# Patient Record
Sex: Female | Born: 1935 | Race: White | Hispanic: No
Health system: Southern US, Community
[De-identification: ages and names within clinical notes are randomized; demographics above are authoritative.]

---

## 2005-09-30 ENCOUNTER — Other Ambulatory Visit: Payer: Self-pay

## 2005-10-07 ENCOUNTER — Ambulatory Visit: Payer: Self-pay | Admitting: Ophthalmology

## 2012-10-19 ENCOUNTER — Ambulatory Visit: Payer: Self-pay | Admitting: Internal Medicine

## 2013-04-21 ENCOUNTER — Ambulatory Visit: Payer: Self-pay | Admitting: Oncology

## 2014-02-23 ENCOUNTER — Ambulatory Visit: Payer: Self-pay | Admitting: Physician Assistant

## 2014-02-23 LAB — CBC WITH DIFFERENTIAL/PLATELET
Basophil #: 0.1 10*3/uL (ref 0.0–0.1)
Basophil %: 1.1 %
Eosinophil #: 0.1 10*3/uL (ref 0.0–0.7)
Eosinophil %: 1.3 %
HCT: 40 % (ref 35.0–47.0)
HGB: 12.9 g/dL (ref 12.0–16.0)
LYMPHS ABS: 1.5 10*3/uL (ref 1.0–3.6)
Lymphocyte %: 18.8 %
MCH: 27.4 pg (ref 26.0–34.0)
MCHC: 32.3 g/dL (ref 32.0–36.0)
MCV: 85 fL (ref 80–100)
MONO ABS: 0.9 x10 3/mm (ref 0.2–0.9)
MONOS PCT: 10.4 %
NEUTROS ABS: 5.6 10*3/uL (ref 1.4–6.5)
Neutrophil %: 68.4 %
Platelet: 343 10*3/uL (ref 150–440)
RBC: 4.71 10*6/uL (ref 3.80–5.20)
RDW: 15.5 % — ABNORMAL HIGH (ref 11.5–14.5)
WBC: 8.2 10*3/uL (ref 3.6–11.0)

## 2014-02-27 ENCOUNTER — Inpatient Hospital Stay: Payer: Self-pay | Admitting: Internal Medicine

## 2014-02-27 LAB — CBC WITH DIFFERENTIAL/PLATELET
BASOS ABS: 0.1 10*3/uL (ref 0.0–0.1)
Basophil %: 0.9 %
EOS ABS: 0 10*3/uL (ref 0.0–0.7)
EOS PCT: 0 %
HCT: 40 % (ref 35.0–47.0)
HGB: 12.6 g/dL (ref 12.0–16.0)
LYMPHS PCT: 5.7 %
Lymphocyte #: 0.9 10*3/uL — ABNORMAL LOW (ref 1.0–3.6)
MCH: 27.3 pg (ref 26.0–34.0)
MCHC: 31.5 g/dL — ABNORMAL LOW (ref 32.0–36.0)
MCV: 87 fL (ref 80–100)
MONO ABS: 0.8 x10 3/mm (ref 0.2–0.9)
MONOS PCT: 4.8 %
Neutrophil #: 14.4 10*3/uL — ABNORMAL HIGH (ref 1.4–6.5)
Neutrophil %: 88.6 %
PLATELETS: 375 10*3/uL (ref 150–440)
RBC: 4.62 10*6/uL (ref 3.80–5.20)
RDW: 15.2 % — AB (ref 11.5–14.5)
WBC: 16.3 10*3/uL — AB (ref 3.6–11.0)

## 2014-02-27 LAB — BASIC METABOLIC PANEL
ANION GAP: 9 (ref 7–16)
BUN: 22 mg/dL — AB (ref 7–18)
CO2: 29 mmol/L (ref 21–32)
Calcium, Total: 9.8 mg/dL (ref 8.5–10.1)
Chloride: 101 mmol/L (ref 98–107)
Creatinine: 0.9 mg/dL (ref 0.60–1.30)
EGFR (African American): >60
Glucose: 152 mg/dL — ABNORMAL HIGH (ref 65–99)
Osmolality: 284 (ref 275–301)
Potassium: 3.9 mmol/L (ref 3.5–5.1)
SODIUM: 139 mmol/L (ref 136–145)

## 2014-02-27 LAB — LIPID PANEL
CHOLESTEROL: 165 mg/dL (ref 0–200)
HDL: 53 mg/dL (ref 40–60)
LDL CHOLESTEROL, CALC: 95 mg/dL (ref 0–100)
Triglycerides: 85 mg/dL (ref 0–200)
VLDL Cholesterol, Calc: 17 mg/dL (ref 5–40)

## 2014-02-27 LAB — PROTIME-INR
INR: 1.2
Prothrombin Time: 14.6 secs (ref 11.5–14.7)

## 2014-02-27 LAB — TROPONIN I
TROPONIN-I: 2.4 ng/mL — AB
Troponin-I: 3.1 ng/mL — ABNORMAL HIGH
Troponin-I: 2.8 ng/mL — ABNORMAL HIGH

## 2014-02-27 LAB — APTT
ACTIVATED PTT: 50.3 s — AB (ref 23.6–35.9)
Activated PTT: 30.9 secs (ref 23.6–35.9)

## 2014-02-27 LAB — CK-MB
CK-MB: 7.3 ng/mL — ABNORMAL HIGH (ref 0.5–3.6)
CK-MB: 8.1 ng/mL — ABNORMAL HIGH (ref 0.5–3.6)
CK-MB: 7.9 ng/mL — ABNORMAL HIGH (ref 0.5–3.6)

## 2014-02-28 LAB — BASIC METABOLIC PANEL
ANION GAP: 10 (ref 7–16)
BUN: 23 mg/dL — ABNORMAL HIGH (ref 7–18)
CHLORIDE: 107 mmol/L (ref 98–107)
CO2: 27 mmol/L (ref 21–32)
Calcium, Total: 8.9 mg/dL (ref 8.5–10.1)
Creatinine: 0.81 mg/dL (ref 0.60–1.30)
EGFR (African American): >60
EGFR (Non-African Amer.): >60
GLUCOSE: 136 mg/dL — AB (ref 65–99)
Osmolality: 293 (ref 275–301)
Potassium: 4.2 mmol/L (ref 3.5–5.1)
SODIUM: 144 mmol/L (ref 136–145)

## 2014-02-28 LAB — HEPARIN LEVEL (UNFRACTIONATED)
Anti-Xa(Unfractionated): 0.13 IU/mL — ABNORMAL LOW (ref 0.30–0.70)
Anti-Xa(Unfractionated): 0.4 IU/mL (ref 0.30–0.70)

## 2014-02-28 LAB — CBC WITH DIFFERENTIAL/PLATELET
BASOS ABS: 0 10*3/uL (ref 0.0–0.1)
BASOS PCT: 0.1 %
EOS PCT: 0 %
Eosinophil #: 0 10*3/uL (ref 0.0–0.7)
HCT: 36.3 % (ref 35.0–47.0)
HGB: 11.6 g/dL — ABNORMAL LOW (ref 12.0–16.0)
Lymphocyte #: 0.6 10*3/uL — ABNORMAL LOW (ref 1.0–3.6)
Lymphocyte %: 3.7 %
MCH: 27.7 pg (ref 26.0–34.0)
MCHC: 32.1 g/dL (ref 32.0–36.0)
MCV: 86 fL (ref 80–100)
MONO ABS: 0.7 x10 3/mm (ref 0.2–0.9)
MONOS PCT: 4.9 %
Neutrophil #: 13.9 10*3/uL — ABNORMAL HIGH (ref 1.4–6.5)
Neutrophil %: 91.3 %
PLATELETS: 303 10*3/uL (ref 150–440)
RBC: 4.21 10*6/uL (ref 3.80–5.20)
RDW: 15.3 % — AB (ref 11.5–14.5)
WBC: 15.3 10*3/uL — ABNORMAL HIGH (ref 3.6–11.0)

## 2014-03-01 LAB — CBC WITH DIFFERENTIAL/PLATELET
BASOS PCT: 0.1 %
Basophil #: 0 10*3/uL (ref 0.0–0.1)
EOS ABS: 0 10*3/uL (ref 0.0–0.7)
Eosinophil %: 0.1 %
HCT: 34.7 % — AB (ref 35.0–47.0)
HGB: 11.1 g/dL — ABNORMAL LOW (ref 12.0–16.0)
Lymphocyte #: 1 10*3/uL (ref 1.0–3.6)
Lymphocyte %: 8 %
MCH: 27.7 pg (ref 26.0–34.0)
MCHC: 31.9 g/dL — ABNORMAL LOW (ref 32.0–36.0)
MCV: 87 fL (ref 80–100)
MONO ABS: 0.9 x10 3/mm (ref 0.2–0.9)
Monocyte %: 7.4 %
Neutrophil #: 10.8 10*3/uL — ABNORMAL HIGH (ref 1.4–6.5)
Neutrophil %: 84.4 %
Platelet: 313 10*3/uL (ref 150–440)
RBC: 4.01 10*6/uL (ref 3.80–5.20)
RDW: 15.3 % — ABNORMAL HIGH (ref 11.5–14.5)
WBC: 12.8 10*3/uL — AB (ref 3.6–11.0)

## 2014-03-01 LAB — HEMOGLOBIN A1C: Hemoglobin A1C: 6 % (ref 4.2–6.3)

## 2014-03-01 LAB — BASIC METABOLIC PANEL
ANION GAP: 9 (ref 7–16)
BUN: 29 mg/dL — ABNORMAL HIGH (ref 7–18)
CHLORIDE: 104 mmol/L (ref 98–107)
CO2: 30 mmol/L (ref 21–32)
Calcium, Total: 8.7 mg/dL (ref 8.5–10.1)
Creatinine: 0.83 mg/dL (ref 0.60–1.30)
EGFR (African American): >60
EGFR (Non-African Amer.): >60
Glucose: 92 mg/dL (ref 65–99)
Osmolality: 290 (ref 275–301)
POTASSIUM: 4 mmol/L (ref 3.5–5.1)
SODIUM: 143 mmol/L (ref 136–145)

## 2014-03-01 LAB — BODY FLUID CELL COUNT WITH DIFFERENTIAL
BASOS ABS: 0 %
EOS PCT: 0 %
LYMPHS PCT: 78 %
NUCLEATED CELL COUNT: 1608 /mm3
Neutrophils: 19 %
Other Cells BF: 0 %
Other Mononuclear Cells: 3 %

## 2014-03-01 LAB — MAGNESIUM: Magnesium: 2.4 mg/dL

## 2014-03-01 LAB — HEPARIN LEVEL (UNFRACTIONATED): Anti-Xa(Unfractionated): 0.37 IU/mL (ref 0.30–0.70)

## 2014-03-01 LAB — PROTEIN, BODY FLUID: Protein, Body Fluid: 4.3 g/dL

## 2014-03-01 LAB — LACTATE DEHYDROGENASE, PLEURAL OR PERITONEAL FLUID: LDH, Body Fluid: 105 U/L

## 2014-03-01 LAB — GLUCOSE, SEROUS FLUID: Glucose, Body Fluid: 110 mg/dL

## 2014-03-01 LAB — AMYLASE, BODY FLUID: AMYLASE, BODY FLUID: 25 U/L

## 2014-03-02 LAB — CBC WITH DIFFERENTIAL/PLATELET
BASOS ABS: 0 10*3/uL (ref 0.0–0.1)
Basophil %: 0.1 %
EOS PCT: 0.1 %
Eosinophil #: 0 10*3/uL (ref 0.0–0.7)
HCT: 35.3 % (ref 35.0–47.0)
HGB: 11.3 g/dL — AB (ref 12.0–16.0)
LYMPHS PCT: 7.2 %
Lymphocyte #: 0.9 10*3/uL — ABNORMAL LOW (ref 1.0–3.6)
MCH: 27.7 pg (ref 26.0–34.0)
MCHC: 32.1 g/dL (ref 32.0–36.0)
MCV: 86 fL (ref 80–100)
MONOS PCT: 6 %
Monocyte #: 0.7 x10 3/mm (ref 0.2–0.9)
NEUTROS ABS: 10.3 10*3/uL — AB (ref 1.4–6.5)
NEUTROS PCT: 86.6 %
Platelet: 289 10*3/uL (ref 150–440)
RBC: 4.08 10*6/uL (ref 3.80–5.20)
RDW: 14.9 % — AB (ref 11.5–14.5)
WBC: 11.9 10*3/uL — ABNORMAL HIGH (ref 3.6–11.0)

## 2014-03-02 LAB — HEPARIN LEVEL (UNFRACTIONATED)
ANTI-XA(UNFRACTIONATED): 0.24 [IU]/mL — AB (ref 0.30–0.70)
Anti-Xa(Unfractionated): 0.32 IU/mL (ref 0.30–0.70)

## 2014-03-03 LAB — CBC WITH DIFFERENTIAL/PLATELET
BASOS ABS: 0 10*3/uL (ref 0.0–0.1)
BASOS PCT: 0.1 %
Eosinophil #: 0.1 10*3/uL (ref 0.0–0.7)
Eosinophil %: 0.9 %
HCT: 36.3 % (ref 35.0–47.0)
HGB: 11.6 g/dL — AB (ref 12.0–16.0)
LYMPHS PCT: 11.8 %
Lymphocyte #: 1.1 10*3/uL (ref 1.0–3.6)
MCH: 27.5 pg (ref 26.0–34.0)
MCHC: 31.9 g/dL — ABNORMAL LOW (ref 32.0–36.0)
MCV: 86 fL (ref 80–100)
MONO ABS: 0.8 x10 3/mm (ref 0.2–0.9)
Monocyte %: 8.6 %
NEUTROS ABS: 7.6 10*3/uL — AB (ref 1.4–6.5)
NEUTROS PCT: 78.6 %
PLATELETS: 324 10*3/uL (ref 150–440)
RBC: 4.22 10*6/uL (ref 3.80–5.20)
RDW: 15.1 % — ABNORMAL HIGH (ref 11.5–14.5)
WBC: 9.6 10*3/uL (ref 3.6–11.0)

## 2014-03-03 LAB — HEPARIN LEVEL (UNFRACTIONATED): Anti-Xa(Unfractionated): 0.49 IU/mL (ref 0.30–0.70)

## 2014-03-05 LAB — BODY FLUID CULTURE

## 2014-03-15 ENCOUNTER — Ambulatory Visit: Payer: Self-pay | Admitting: Oncology

## 2014-03-25 ENCOUNTER — Ambulatory Visit: Payer: Self-pay | Admitting: Cardiology

## 2014-04-01 ENCOUNTER — Inpatient Hospital Stay: Payer: Self-pay | Admitting: Internal Medicine

## 2014-04-01 ENCOUNTER — Ambulatory Visit: Payer: Self-pay | Admitting: Internal Medicine

## 2014-04-01 LAB — CBC
HCT: 40.9 % (ref 35.0–47.0)
HGB: 12.8 g/dL (ref 12.0–16.0)
MCH: 27.2 pg (ref 26.0–34.0)
MCHC: 31.4 g/dL — ABNORMAL LOW (ref 32.0–36.0)
MCV: 87 fL (ref 80–100)
PLATELETS: 335 10*3/uL (ref 150–440)
RBC: 4.71 10*6/uL (ref 3.80–5.20)
RDW: 15.2 % — ABNORMAL HIGH (ref 11.5–14.5)
WBC: 6.6 10*3/uL (ref 3.6–11.0)

## 2014-04-01 LAB — BODY FLUID CELL COUNT WITH DIFFERENTIAL
Basophil: 0 %
Eosinophil: 12 %
LYMPHS PCT: 68 %
NUCLEATED CELL COUNT: 1750 /mm3
Neutrophils: 8 %
Other Cells BF: 6 %
Other Mononuclear Cells: 6 %

## 2014-04-01 LAB — BASIC METABOLIC PANEL
Anion Gap: 9 (ref 7–16)
BUN: 16 mg/dL (ref 7–18)
CALCIUM: 8.6 mg/dL (ref 8.5–10.1)
Chloride: 106 mmol/L (ref 98–107)
Co2: 27 mmol/L (ref 21–32)
Creatinine: 0.72 mg/dL (ref 0.60–1.30)
EGFR (African American): >60
EGFR (Non-African Amer.): >60
Glucose: 108 mg/dL — ABNORMAL HIGH (ref 65–99)
Osmolality: 285 (ref 275–301)
POTASSIUM: 4.1 mmol/L (ref 3.5–5.1)
Sodium: 142 mmol/L (ref 136–145)

## 2014-04-01 LAB — TROPONIN I
TROPONIN-I: 0.03 ng/mL
TROPONIN-I: 0.04 ng/mL

## 2014-04-01 LAB — DIFFERENTIAL
Basophil #: 0 10*3/uL (ref 0.0–0.1)
Basophil %: 0.7 %
EOS PCT: 2.3 %
EOS PCT: 3 %
Eosinophil #: 0.2 10*3/uL (ref 0.0–0.7)
LYMPHS PCT: 19 %
LYMPHS PCT: 19.1 %
Lymphocyte #: 1.3 10*3/uL (ref 1.0–3.6)
MONO ABS: 0.6 x10 3/mm (ref 0.2–0.9)
Monocyte %: 8.5 %
Monocytes: 7 %
NEUTROS PCT: 69.4 %
Neutrophil #: 4.8 10*3/uL (ref 1.4–6.5)
SEGMENTED NEUTROPHILS: 71 %

## 2014-04-01 LAB — LACTATE DEHYDROGENASE, PLEURAL OR PERITONEAL FLUID: LDH, Body Fluid: 277 U/L

## 2014-04-01 LAB — AMYLASE, BODY FLUID: AMYLASE, BODY FLUID: 30 U/L

## 2014-04-01 LAB — GLUCOSE, SEROUS FLUID: GLUCOSE, BODY FLUID: 106 mg/dL

## 2014-04-02 LAB — COMPREHENSIVE METABOLIC PANEL
ALBUMIN: 2.2 g/dL — AB (ref 3.4–5.0)
ANION GAP: 4 — AB (ref 7–16)
Alkaline Phosphatase: 82 U/L
BUN: 15 mg/dL (ref 7–18)
Bilirubin,Total: 0.4 mg/dL (ref 0.2–1.0)
CHLORIDE: 104 mmol/L (ref 98–107)
CO2: 35 mmol/L — AB (ref 21–32)
Calcium, Total: 8.3 mg/dL — ABNORMAL LOW (ref 8.5–10.1)
Creatinine: 0.87 mg/dL (ref 0.60–1.30)
EGFR (African American): >60
EGFR (Non-African Amer.): >60
GLUCOSE: 90 mg/dL (ref 65–99)
Osmolality: 285 (ref 275–301)
Potassium: 4.1 mmol/L (ref 3.5–5.1)
SGOT(AST): 32 U/L (ref 15–37)
SGPT (ALT): 31 U/L
Sodium: 143 mmol/L (ref 136–145)
TOTAL PROTEIN: 6.1 g/dL — AB (ref 6.4–8.2)

## 2014-04-02 LAB — CBC WITH DIFFERENTIAL/PLATELET
BASOS PCT: 0.6 %
Basophil #: 0 10*3/uL (ref 0.0–0.1)
EOS PCT: 3.5 %
Eosinophil #: 0.3 10*3/uL (ref 0.0–0.7)
HCT: 39.9 % (ref 35.0–47.0)
HGB: 12.5 g/dL (ref 12.0–16.0)
Lymphocyte #: 1.4 10*3/uL (ref 1.0–3.6)
Lymphocyte %: 17.1 %
MCH: 27.3 pg (ref 26.0–34.0)
MCHC: 31.3 g/dL — ABNORMAL LOW (ref 32.0–36.0)
MCV: 87 fL (ref 80–100)
Monocyte #: 0.7 x10 3/mm (ref 0.2–0.9)
Monocyte %: 9.2 %
NEUTROS ABS: 5.5 10*3/uL (ref 1.4–6.5)
NEUTROS PCT: 69.6 %
Platelet: 304 10*3/uL (ref 150–440)
RBC: 4.58 10*6/uL (ref 3.80–5.20)
RDW: 15.4 % — ABNORMAL HIGH (ref 11.5–14.5)
WBC: 8 10*3/uL (ref 3.6–11.0)

## 2014-04-02 LAB — LIPID PANEL
CHOLESTEROL: 163 mg/dL (ref 0–200)
HDL: 45 mg/dL (ref 40–60)
Ldl Cholesterol, Calc: 91 mg/dL (ref 0–100)
TRIGLYCERIDES: 136 mg/dL (ref 0–200)
VLDL Cholesterol, Calc: 27 mg/dL (ref 5–40)

## 2014-04-02 LAB — PRO B NATRIURETIC PEPTIDE: B-Type Natriuretic Peptide: 2170 pg/mL — ABNORMAL HIGH (ref 0–450)

## 2014-04-02 LAB — MAGNESIUM: MAGNESIUM: 2.1 mg/dL

## 2014-04-04 LAB — BASIC METABOLIC PANEL
Anion Gap: 7 (ref 7–16)
BUN: 19 mg/dL — AB (ref 7–18)
CALCIUM: 8.5 mg/dL (ref 8.5–10.1)
CHLORIDE: 105 mmol/L (ref 98–107)
Co2: 31 mmol/L (ref 21–32)
Creatinine: 0.7 mg/dL (ref 0.60–1.30)
GLUCOSE: 104 mg/dL — AB (ref 65–99)
Osmolality: 288 (ref 275–301)
Potassium: 3.9 mmol/L (ref 3.5–5.1)
SODIUM: 143 mmol/L (ref 136–145)

## 2014-04-04 LAB — CANCER ANTIGEN 27.29: CA 27.29: 52 U/mL — AB (ref 0.0–38.6)

## 2014-04-07 LAB — BASIC METABOLIC PANEL
Anion Gap: 5 — ABNORMAL LOW (ref 7–16)
BUN: 19 mg/dL — AB (ref 7–18)
CO2: 34 mmol/L — AB (ref 21–32)
Calcium, Total: 8.8 mg/dL (ref 8.5–10.1)
Chloride: 103 mmol/L (ref 98–107)
Creatinine: 0.75 mg/dL (ref 0.60–1.30)
EGFR (African American): >60
EGFR (Non-African Amer.): >60
GLUCOSE: 96 mg/dL (ref 65–99)
Osmolality: 285 (ref 275–301)
POTASSIUM: 3.7 mmol/L (ref 3.5–5.1)
Sodium: 142 mmol/L (ref 136–145)

## 2014-04-11 LAB — COMPREHENSIVE METABOLIC PANEL
ALK PHOS: 89 U/L
Albumin: 1.9 g/dL — ABNORMAL LOW (ref 3.4–5.0)
Anion Gap: 5 — ABNORMAL LOW (ref 7–16)
BUN: 13 mg/dL (ref 7–18)
Bilirubin,Total: 0.4 mg/dL (ref 0.2–1.0)
CALCIUM: 8.4 mg/dL — AB (ref 8.5–10.1)
Chloride: 107 mmol/L (ref 98–107)
Co2: 33 mmol/L — ABNORMAL HIGH (ref 21–32)
Creatinine: 0.59 mg/dL — ABNORMAL LOW (ref 0.60–1.30)
EGFR (African American): >60
EGFR (Non-African Amer.): >60
Glucose: 94 mg/dL (ref 65–99)
Osmolality: 289 (ref 275–301)
Potassium: 4.2 mmol/L (ref 3.5–5.1)
SGOT(AST): 31 U/L (ref 15–37)
SGPT (ALT): 21 U/L
Sodium: 145 mmol/L (ref 136–145)
Total Protein: 5.7 g/dL — ABNORMAL LOW (ref 6.4–8.2)

## 2014-04-11 LAB — CBC WITH DIFFERENTIAL/PLATELET
BASOS PCT: 0.8 %
Basophil #: 0.1 10*3/uL (ref 0.0–0.1)
EOS ABS: 0.2 10*3/uL (ref 0.0–0.7)
Eosinophil %: 3.8 %
HCT: 41.4 % (ref 35.0–47.0)
HGB: 12.9 g/dL (ref 12.0–16.0)
LYMPHS ABS: 1.8 10*3/uL (ref 1.0–3.6)
Lymphocyte %: 27.9 %
MCH: 27.5 pg (ref 26.0–34.0)
MCHC: 31.2 g/dL — ABNORMAL LOW (ref 32.0–36.0)
MCV: 88 fL (ref 80–100)
Monocyte #: 0.6 x10 3/mm (ref 0.2–0.9)
Monocyte %: 10.1 %
NEUTROS PCT: 57.4 %
Neutrophil #: 3.6 10*3/uL (ref 1.4–6.5)
PLATELETS: 306 10*3/uL (ref 150–440)
RBC: 4.71 10*6/uL (ref 3.80–5.20)
RDW: 15.1 % — ABNORMAL HIGH (ref 11.5–14.5)
WBC: 6.3 10*3/uL (ref 3.6–11.0)

## 2014-04-15 ENCOUNTER — Ambulatory Visit: Payer: Self-pay | Admitting: Oncology

## 2014-04-15 DEATH — deceased

## 2014-04-20 ENCOUNTER — Ambulatory Visit: Payer: Self-pay | Admitting: Oncology

## 2014-08-06 NOTE — Consult Note (Signed)
   Comments   Follow up visit made. Family say that pt is agreeable to [facility]. [facility] aware and will see pt in AM. I spoke with Dr Juliann PulseLundquist who will advise re chest tube.   Electronic Signatures: Kandyce Dieguez, Harriett SineNancy (MD)  (Signed 28-Dec-15 17:29)  Authored: Palliative Care   Last Updated: 28-Dec-15 17:29 by Makana Rostad, Harriett SineNancy (MD)

## 2014-08-06 NOTE — H&P (Signed)
PATIENT NAME:  Claire Roach, Claire Roach MR#:  [id number] DATE OF BIRTH:  [birth year]  DATE OF ADMISSION:  02/27/2014  PRIMARY CARE PHYSICIAN: Bari Edward, MD  CHIEF COMPLAINT: Heartburn.   HISTORY OF PRESENT ILLNESS: An 81 year old female who was seen at [facility] on the 11th, diagnosed with pneumonia, who presents today with intense burning sensation in her chest. Significant she thought for really bad reflux, it came and went about 6 to 7 times today. She was concerned, she is also very weak and has decreased p.o. appetite since early last week. In the ER, her laboratories were performed. She has elevation in her troponin at 2.40. It is noted she has some new T wave changes in the anterolateral leads as well. Dr. Juliann Pares from cardiology has been consulted. She has been consented and started on a heparin drip.   REVIEW OF SYSTEMS: CONSTITUTIONAL: No fever. Positive fatigue, weakness. No weight loss or gain.  EYES: No blurred or double vision, glaucoma. EARS, NOSE AND THROAT: No ear pain, hearing loss, seasonal allergies, postnasal drip. RESPIRATORY: No cough, wheezing, hemoptysis. Positive recent diagnosis of pneumonia.  CARDIOVASCULAR: Positive heartburn. No orthopnea, edema, arrhythmia, dyspnea on exertion, palpitations, syncope.  GASTROINTESTINAL: Positive nausea. No vomiting, diarrhea, abdominal pain, melena, or ulcers.  GENITOURINARY: No dysuria or hematuria. ENDOCRINE: No polyuria or polydipsia. HEMATOLOGIC AND LYMPHATICS: No bleeding, swollen glands.  SKIN: No rash or lesions.  MUSCULOSKELETAL: She has generalized weakness, but no pain in the shoulders or knees.  NEUROLOGIC: No history of CVA, TIA or seizures.  PSYCHIATRIC: No history of anxiety or depression.   PAST MEDICAL HISTORY:  1.  Hypertension.  2.  History of breast cancer.   PAST SURGICAL HISTORY: Bilateral mastectomy.   ALLERGIES: No known drug allergies.   MEDICATIONS: Norvasc 2.5 mg daily. She was given  on azithromycin Z-Pak for her pneumonia.   SOCIAL HISTORY: No tobacco, alcohol or drug use.   FAMILY HISTORY: Positive for hypertension.   PHYSICAL EXAMINATION:  VITAL SIGNS: Temperature 97.6, pulse 114, respirations 18, blood pressure 130/84, on room air 95%.  GENERAL: The patient is alert, oriented, not in acute distress.   HEENT: Head is atraumatic. Pupils are reactive. Sclerae are anicteric. Mucous membranes are moist. Oropharynx is clear.  NECK: Supple. No JVD, carotid bruit or enlarged thyroid.  CARDIOVASCULAR: Regular rate and rhythm. No murmurs, gallops or rubs. PMI is not displaced.  LUNGS: Clear to auscultation without crackles, rales, rhonchi or wheezing. She has some decreased breath sounds at the right base.  ABDOMEN: Bowel sounds are positive. Nontender, nondistended. No hepatosplenomegaly.  EXTREMITIES: No clubbing, cyanosis or edema.  NEUROLOGIC: Cranial nerves II-XII are intact. No focal deficits.  SKIN: Without rash or lesions.   LABORATORIES: Troponin is 2.40. Sodium 139, potassium 3.9, chloride 101, bicarbonate 29, BUN 22, creatinine 0.9 and glucose is 152. White blood cells 15.3, hemoglobin 12.6, hematocrit 40, platelets of 375,000. Troponin is 2.40. CPK-MB 7.3.   EKG shows T wave inversions in the anterolateral leads.   Chest x-ray shows no significant interval change in appearance of the chest with persistent moderate to large layering right pleural effusion and associated right lower lobe atelectasis versus infiltrate.   ASSESSMENT AND PLAN: An 81 year old female who was recently diagnosed with pneumonia, on Z-Pak, who presents with burning chest pain, found to have an elevation in her troponin and EKG changes consistent with non-ST segment elevation myocardial infarction. 1.  Non-ST segment elevation myocardial infarction. Initial episode possibly from the left circumflex artery.  We will continue heparin drip. I have started aspirin, statin, metoprolol and  nitroglycerin. Cardiology has been consulted. We will  continue to follow troponins. The patient will likely require a cardiac catheterization in the a.m.  2.  Community-acquired pneumonia. The patient was recently diagnosed with a pneumonia, on a Z-Pak. I have changed it to Levaquin, this is a better medication for pneumonia. She has a moderate to large pleural effusion but is not complaining of shortness of breath and this definitely should be followed up, especially given her history of breast cancer in the remote past.  3.  Essential hypertension. The patient's blood pressure on arrival is adequately controlled. Due to her non-ST segment elevation myocardial infarction, I have changed her Norvasc to metoprolol and we will continue to follow.  4.  Hyperglycemia. The patient does not have a history of diabetes.  BMP in the a.m. and if her blood sugar still remains elevated may pursue further workup including hemoglobin A1c.  5.  The patient is a DNR status.   TIME SPENT: Approximately 45 minutes.    ____________________________ Janyth ContesSital P. Juliene PinaMody, MD spm:TT D: 02/27/2014 15:10:34 ET T: 02/27/2014 16:25:18 ET JOB#: [id number]  cc: Bitha Fauteux P. Juliene PinaMody, MD, <Dictator> Bari EdwardLaura Berglund, MD Janyth ContesSITAL P Jenicka Coxe MD ELECTRONICALLY SIGNED 02/27/2014 17:17

## 2014-08-06 NOTE — Op Note (Signed)
PATIENT NAME:  Claire Roach, Claire Roach MR#:  [id number] DATE OF BIRTH:  09-02-1929  DATE OF PROCEDURE:  04/01/2014  SURGEON: Marcial Pacasimothy Roach. Thelma Bargeaks, MD  ASSISTANT: None.   PREOPERATIVE DIAGNOSIS: Tension pneumothorax, right side.   POSTOPERATIVE DIAGNOSIS: Tension pneumothorax, right side.   OPERATION PERFORMED: Insertion of 24-French chest tube.   INDICATIONS FOR PROCEDURE: Claire Roach is an 81 year old woman who presented to the radiology department for evaluation of shortness of breath. She had a chest CT scan ordered as part of her evaluation and that revealed a tension pneumothorax. She was then transferred to the Emergency Room for management. I saw the patient in consultation and explained to her the indications and risks of chest tube insertion. The patient gave her informed consent.   DESCRIPTION OF PROCEDURE: The patient was approached in the Emergency Room with her lying in the stretcher. She was turned with the left side down, right side up and the patient was prepped and draped in the usual sterile fashion. Using 1% lidocaine without epinephrine, a skin wheal was raised and a subcutaneous anesthetic was anesthetized. The ribs were anesthetized. The chest was entered at about the fourth intercostal space. A 24-French chest tube was then inserted into the pleural space. The catheter glided easily into the pleural space and was secured with #1 silk. There was an immediate rush of air as well as about 500 mL of serous to serosanguineous fluid. This was sent for appropriate analysis. Sterile dressings were applied. A chest x-ray was obtained. The tube was in a little bit far and it was then withdrawn about 6 cm under sterile conditions and the tube was resecured. The patient tolerated the procedure well. She will be admitted to the hospital for management of her presumed pneumonia and heart failure.   ____________________________ Sheppard Plumberimothy Roach. Thelma Bargeaks, MD teo:ST D: 04/01/2014 14:51:35  ET T: 04/01/2014 21:40:16 ET JOB#: [id number]  cc: Marcial Pacasimothy Roach. Thelma Bargeaks, MD, <Dictator> Jasmine DecemberIMOTHY Roach Rogerick Baldwin MD ELECTRONICALLY SIGNED 04/04/2014 9:08

## 2014-08-06 NOTE — Consult Note (Signed)
Note Type Consult   Subjective: Chief Complaint/Diagnosis:   Pleural effusion with mediastinal lymphadenopathy concerning for underlying malignancy. HPI:   Patient still with drainage from her chest tube, but does admit her shortness of breath has improved. She continues to have pain which is unchanged. She otherwise feels well.   Review of Systems:  Performance Status (ECOG): 1  Review of Systems:   As per HPI. Otherwise, 10 point system review was negative.   Allergies:  No Known Allergies:   PFSH: Additional Past Medical and Surgical History: breast cancer in 1981 status post bilateral mastectomy. Hypertension, cardiomyopathy.    Family history: Hypertension.    Social history: Positive for tobacco and alcohol.   Home Medications: Medication Instructions Last Modified Date/Time  carvedilol 3.125 mg oral tablet 1 tab(s) orally once a day 18-Dec-15 14:57  lisinopril 5 mg oral tablet 1 tab(s) orally once a day 18-Dec-15 14:57  aspirin 81 mg oral delayed release tablet 1 tab(s) orally once a day 18-Dec-15 14:57  Tylenol 500 mg oral tablet 1 to 2 tab(s) orally every 6 hours as needed for pain. 18-Dec-15 14:57  mirtazapine 15 mg oral tablet 1 tab(s) orally once a day (at bedtime) 18-Dec-15 14:57  Flonase 50 mcg/inh nasal spray 1 spray(s) nasal once a day 18-Dec-15 14:57   Vital Signs:  :: vital signs stable, patient afebrile.   Physical Exam:  General: well developed, well nourished, and in no acute distress  Mental Status: normal affect  Eyes: anicteric sclera  Respiratory: diminished breath sounds on right. Chest tube noted draining serosanguineous fluid.  Cardiovascular: regular rate and rhythm, no murmur, rub, or gallop  Gastrointestinal: soft, nondistended, nontender, no organomegaly.  normal active bowel sounds  Musculoskeletal: No edema  Skin: No rash or petechiae noted  Neurological: alert, answering all questions appropriately.  Cranial nerves grossly intact    Laboratory Results: Routine Chem:  21-Dec-15 03:59   Glucose, Serum  104  BUN  19  Creatinine (comp) 0.70  Sodium, Serum 143  Potassium, Serum 3.9  Chloride, Serum 105  CO2, Serum 31  Calcium (Total), Serum 8.5  Anion Gap 7  Osmolality (calc) 288  eGFR (African American) >60  eGFR (Non-African American) >60 (eGFR values <58m/min/1.73 m2 may be an indication of chronic kidney disease (CKD). Calculated eGFR, using the MRDR Study equation, is useful in  patients with stable renal function. The eGFR calculation will not be reliable in acutely ill patients when serum creatinine is changing rapidly. It is not useful in patients on dialysis. The eGFR calculation may not be applicable to patients at the low and high extremes of body sizes, pregnant women, and vegetarians.)   Assessment and Plan: Impression:   Pleural effusion with mediastinal lymphadenopathy concerning for underlying malignancy. Plan:   1. Pleural effusion: Pathology does not indicate underlying malignancy. Unclear etiology of effusion. Patient continues to have drainage from her chest tube which will likely remain for several more days. Despite negative pathology, underlying malignancy is still of concern. Case discussed with Dr. OGenevive Bi Will consider reimaging or possibly bronchoscopy in the future as an outpatient after chest tube is removed. PET scan would not be helpful at this point given her recent history of pneumonia. CA-27-29 is only mildly elevated which could be related to chronic inflamationMediastinal lymphadenopathy: Possibly reactive, consider bronchoscopy as above.Follow-up: [facility]. with questions.  Electronic Signatures: FDelight Hoh(MD)  (Signed 22-Dec-15 14:58)  Authored: Note Type, CC/HPI,  Review of Systems, ALLERGIES, Patient Family Social History, HOME MEDICATIONS, Vital Signs, Physical Exam, Lab Results Review,  Assessment and Plan   Last Updated: 22-Dec-15 14:58 by Delight Hoh (MD)

## 2014-08-06 NOTE — Consult Note (Signed)
Brief Consult Note: Diagnosis: Right tension hydropneumothorax.   Patient was seen by consultant.   Consult note dictated.   Recommend to proceed with surgery or procedure.   Discussed with Attending MD.   Comments: 20 French chest tube inserted under sterile condiitions.  Prompt return of air and 700 cc of serous to serosanguinous fluid.  Sent for cytology, CBC with diff, LDH, Glucose, TP, amylase, Cytology.   CXRay shows tube in a little far and it was withdrawn about 6 cm.    Will continue to follow.  Electronic Signatures: Jasmine Decemberaks, Osamu Olguin E (MD)  (Signed 18-Dec-15 14:42)  Authored: Brief Consult Note   Last Updated: 18-Dec-15 14:42 by Jasmine Decemberaks, Velina Drollinger E (MD)

## 2014-08-06 NOTE — H&P (Signed)
PATIENT NAME:  Claire Roach, BOSCH MR#:  [id number] DATE OF BIRTH:  11-15-1929  DATE OF ADMISSION:  04/01/2014  PRIMARY CARE PHYSICIAN: Oneita Kras, MD  REFERRING PHYSICIAN: Randon Goldsmith. Lord, MD  CHIEF COMPLAINT: Shortness of breath on and off for the past 3 weeks, worsening for 2 days.   HISTORY OF PRESENT ILLNESS: An 81 year old Caucasian female with a history of MI, cardiomyopathy and pneumonia, was sent to ED by her primary care physician due to shortness of breath on and off for the past 3 weeks and worsening for the past 2 days. The patient is alert, awake, oriented, in no acute distress. The patient had an MI last month. Dr. Lady Gary did a cardiac catheterization which showed normal coronary arteries. It was assumed that the patient has a stress induced cardiomyopathy, or Takotsubo cardiomyopathy. In addition, the patient has a right-sided pneumonia, which was treated with antibiotics. Also, the patient has  right-sided pleural effusion,  she got a thoracenteses with 2 L drainage. Since discharge last month, the patient has been feeling weak with shortness of breath on and off. The symptoms have been worsening for the past 3 days, but the patient denies any fever or chills. No palpitations, chest pain, orthopnea, or nocturnal dyspnea. No leg edema. The patient went to PCPs office and was sent to the ED for further evaluation. The patient got a CAT scan of her chest which showed right-sided pleural effusion with a pericardial effusion. Dr. Thelma Barge did a chest tube placement and drained about 700 mL of bloody fluid.   PAST MEDICAL HISTORY: MI, pneumonia with a right-sided pleural effusion, hypertension, history of breast cancer, Takotsubo cardiomyopathy or stress-induced cardiomyopathy,   PAST SURGICAL HISTORY: Bilateral mastectomy.   SOCIAL HISTORY: No smoking or drinking or illicit drugs.   FAMILY HISTORY: Positive for hypertension.   ALLERGIES: None.   HOME MEDICATIONS: Tylenol 500 mg p.o.  1-2 tablets every 6 hours p.r.n., mirtazapine 15 mg p.o. at bedtime, lisinopril 5 mg p.o. daily, Flonase 50 mcg 1 spray once a day, Coreg 3.125 mg p.o. daily, aspirin 81 mg p.o. daily.   REVIEW OF SYSTEMS:  CONSTITUTIONAL: The patient denies any fever or chills. No headache or dizziness, but has generalized weakness and weight loss and poor appetite.  EYES: No double vision or blurred vision.  ENT: No postnasal drip, slurred speech or dysphagia.  CARDIOVASCULAR: No chest pain, palpitations, orthopnea or nocturnal dyspnea. No leg edema.  PULMONARY: Positive for cough and shortness of breath, but no hemoptysis or wheezing.  GASTROINTESTINAL: No abdominal pain, nausea, vomiting, diarrhea. No melena or bloody stool.  GENITOURINARY: No dysuria, hematuria, or incontinence.  SKIN: No rash or jaundice.  NEUROLOGIC: No syncope, loss of consciousness, or seizure.  HEMATOLOGY: No easy bruising or bleeding.  ENDOCRINE: No polyuria, polydipsia, heat or cold intolerance.   PHYSICAL EXAMINATION:  VITAL SIGNS: Temperature 97.5, blood pressure 122/64, pulse 81, oxygen saturation 100% on oxygen. GENERAL: The patient is alert, awake, oriented, in no acute distress.  HEENT: Pupils round, equal, and reactive to light and accommodation. Dry oral mucosa.  NECK: Supple. No JVD or carotid bruit. No lymphadenopathy. No thyromegaly.  CARDIOVASCULAR: S1, S2. Regular rate and rhythm. No murmurs or gallops.  PULMONARY: Bilateral air entry. No wheezing or rales. Chest tube on the right side with bloody fluid drainage, about 700 mL.   ABDOMEN: Soft. No distention or tenderness. No organomegaly. Bowel sounds present.  EXTREMITIES: No edema, clubbing or cyanosis. No calf tenderness. Bilateral pedal pulses present.  SKIN: No rash or jaundice.  NEUROLOGY: A and O x 3. No focal deficit. Power 5/5. Sensation intact.   LABORATORY DATA: Chest x-ray after chest tube placement showed chest tube placement with extension of the  right lung, and evacuation of right-sided pneumothorax, stable right-sided pleural effusion. Pleural effusion lab tests showed an LDH 277, amylase 30, glucose 106. The patient's CBC is within normal range. Glucose 108, BUN 16, creatinine 0.72. Electrolytes normal. Troponin 0.03.   CAT scan of the chest before chest tube placement showed moderate to large-sized right-sided hydropneumothorax with near complete consolidation of the right middle and lower lobe. Mediastinal lymphadenopathy. Worrisome metastatic disease. Also suspect underlying pulmonary malignancy. A 2.2 cm nodule on the right adrenal gland. Cardiomegaly with development of a moderate to large-sized pericardial effusion. Also, fusiform aneurysmal dilatation of the ascending thoracic aorta, about 4 cm in diameter. Hiatal hernia. About 3 cm hyperattenuating mass within the left lobe of the thyroid.   IMPRESSIONS:  1.  Right-sided pleural effusion and tension pneumonthorax, status post chest tube placement.  2.  Pericardial effusion.  3.  Possible malignancy and metastasis with history of breast cancer. 4.  Right adrenal gland mass, 2.2 cm.  5.  Aneurysmal dilatation of ascending thoracic aorta.  6.  Hiatal hernia.  7.  Thyroid mass.  8.  Cardiomegaly with possible congestive heart failure with ejection fraction of about 35%.  9.  Hypertension.  10.  History of myocardial infarction.    PLAN OF TREATMENT:  The patient will be admitted to the telemetry floor with telemetry monitoring for right-sided hydropneumothorax. The patient already had chest tube placement with bloody drainage. We will follow up with Dr. Thelma Bargeaks for further recommendation, and follow up fluid cytology.  2.  For pericardia effusion, with a history of cardiomyopathy and possibly congestive heart failure, I will start Lasix b.i.d. Follow up with Dr. Lady GaryFath.  3.  For possible malignancy or metastasis, I will request an oncology consult for further recommendations.  4.  For  cardiomyopathy with ejection fraction 35%, we will continue lisinopril and Coreg.  CODE STATUS:  I have discussed the patient's condition and plan of treatment with the patient and the patient's 2 sons. The patient wants DNR.   TIME SPENT: About 68 minutes     ____________________________ Shaune PollackQing Gary Gabrielsen, MD qc:MT D: 04/01/2014 16:23:55 ET T: 04/01/2014 17:02:30 ET JOB#: [id number]  cc: Shaune PollackQing Vicci Reder, MD, <Dictator> Bari EdwardLaura Berglund, MD  Shaune PollackQING Dontel Harshberger MD ELECTRONICALLY SIGNED 04/02/2014 14:56

## 2014-08-06 NOTE — Consult Note (Signed)
Present Illness 81 year old female with history of recent diagnosis of pneumonia who was admitted with midsternal burning chest pain.  EKG showed T-wave inversions a lateral leads.  She had a serum troponin of 2.8.  Consultation was requested by Dr. Allena KatzPatel for evaluation of an abnormal the EKG, abnormal cardiac markers in a patient with chest pain.  Chest x-ray revealed persistent moderate right pleural effusion which was 1st noted on x-ray approximately 1 week ago. She had been treated with antibiotics.  She described it as a midsternal burning with radiation up to her lateral chest.  She has not had previous problems.  She has a family history of heart disease.  She denies current tobacco use.  She currently is pain-free in remains hemodynamically stable in sinus rhythm.   Physical Exam:  GEN well nourished, no acute distress   HEENT PERRL, hearing intact to voice, moist oral mucosa   NECK supple   RESP normal resp effort   CARD Regular rate and rhythm  Murmur   Murmur Systolic   Systolic Murmur Out flow   ABD denies tenderness  normal BS  no Adominal Mass   LYMPH negative neck   EXTR negative cyanosis/clubbing, negative edema   SKIN normal to palpation   NEURO cranial nerves intact, motor/sensory function intact   PSYCH A+O to time, place, person   Review of Systems:  Subjective/Chief Complaint Midsternal chest pain described as burning   General: Fatigue  Weakness   Skin: No Complaints   ENT: No Complaints   Eyes: No Complaints   Neck: No Complaints   Respiratory: No Complaints   Cardiovascular: Chest pain or discomfort   Gastrointestinal: Heartburn   Genitourinary: No Complaints   Vascular: No Complaints   Musculoskeletal: No Complaints   Neurologic: No Complaints   Hematologic: No Complaints   Endocrine: No Complaints   Psychiatric: No Complaints   Review of Systems: All other systems were reviewed and found to be negative    Medications/Allergies Reviewed Medications/Allergies reviewed   Family & Social History:  Family and Social History:  Family History Coronary Artery Disease  Hypertension  Diabetes Mellitus   Social History negative tobacco   EKG:  EKG NSR   Abnormal NSSTTW changes  sinus rhythm with nonspecific ST T wave changes    No Known Allergies:    Impression 81 year old female who was admitted with midsternal chest pain.  She has a history of a right pleural effusion which is been present for approximately 1 week.  She now complains of midsternal chest pain and has ruled in with a non ST elevation myocardia infarction with a serum troponin of 2.8.  She is currently pain free and hemodynamically stable.  She remains on enteric-coated aspirin, heparin and beta blockers.  Will place her on high-intensity statins the well he evaluate her coronary anatomy given her non ST elevation myocardia infarction.  Risk and benefits of the procedure were explained to the patient in her family and they agreed to proceed.   Plan 1. Continue with current regimen including metoprolol 25 mg twice daily, aspirin and heparin.  The aspirin 81 mg daily 2. Will add atorvastatin at 40 mg daily 3. will review echocardiogram when available 4. The left cardiac catheterization to evaluate coronary anatomy to guide further therapy. 5. Will refer to cardiac rehab phase 2   Electronic Signatures: Dalia HeadingFath, Klaus Casteneda A (MD)  (Signed [phone]-Nov-15 10:39)  Authored: General Aspect/Present Illness, History and Physical Exam, Review of System, Family & Social History,  Home Medications, EKG , Allergies, Impression/Plan   Last Updated: 16-Nov-15 10:39 by Dalia Heading (MD)

## 2014-08-06 NOTE — Consult Note (Signed)
Note Type Consult   Subjective: Chief Complaint/Diagnosis:   Pleural effusion with mediastinal lymphadenopathy concerning for underlying malignancy. HPI:   Patient is an 81 year old female who had a 3 week history of worsening shortness of breath. She also had. Right-sided pneumonia and received thoracentesis draining 2 L approximately one week ago. She otherwise has felt well. She has had no fevers. She has no neurologic complaints. She denies any chest pain or hemoptysis. She denies any weight loss. She is currently not having pain. She has no nausea, vomiting, conservation, or diarrhea. She denies any melena or hematochezia. She has no urinary complaints. Patient otherwise feels well and offers no further specific complaints.   Review of Systems:  Performance Status (ECOG): 1  Review of Systems:   As per HPI. Otherwise, 10 point system review was negative.   Allergies:  No Known Allergies:   PFSH: Additional Past Medical and Surgical History: breast cancer in 1981 status post bilateral mastectomy. Hypertension, cardiomyopathy.    Family history: Hypertension.    Social history: Positive for tobacco and alcohol.   Home Medications: Medication Instructions Last Modified Date/Time  carvedilol 3.125 mg oral tablet 1 tab(s) orally once a day 18-Dec-15 14:57  lisinopril 5 mg oral tablet 1 tab(s) orally once a day 18-Dec-15 14:57  aspirin 81 mg oral delayed release tablet 1 tab(s) orally once a day 18-Dec-15 14:57  Tylenol 500 mg oral tablet 1 to 2 tab(s) orally every 6 hours as needed for pain. 18-Dec-15 14:57  mirtazapine 15 mg oral tablet 1 tab(s) orally once a day (at bedtime) 18-Dec-15 14:57  Flonase 50 mcg/inh nasal spray 1 spray(s) nasal once a day 18-Dec-15 14:57   Vital Signs:  :: vital signs stable, patient afebrile.   Physical Exam:  General: well developed, well nourished, and in no acute distress  Mental Status: normal affect  Eyes: anicteric sclera  Head, Ears,  Nose,Throat: Normocephalic, moist mucous membranes, clear oropharynx without erythema or thrush.  Neck, Thyroid: No palpable lymphadenopathy, thyroid midline without nodules.  Respiratory: diminished breath sounds on right. Chest tube noted draining serosanguineous fluid.  Cardiovascular: regular rate and rhythm, no murmur, rub, or gallop  Gastrointestinal: soft, nondistended, nontender, no organomegaly.  normal active bowel sounds  Musculoskeletal: No edema  Skin: No rash or petechiae noted  Neurological: alert, answering all questions appropriately.  Cranial nerves grossly intact   Laboratory Results: Hepatic:  19-Dec-15 04:24   Bilirubin, Total 0.4  Alkaline Phosphatase 82 (46-116 NOTE: New Reference Range 11/02/13)  SGPT (ALT) 31 (14-63 NOTE: New Reference Range 11/02/13)  SGOT (AST) 32  Total Protein, Serum  6.1  Albumin, Serum  2.2  Routine Chem:  19-Dec-15 04:24   Glucose, Serum 90  BUN 15  Creatinine (comp) 0.87  Sodium, Serum 143  Potassium, Serum 4.1  Chloride, Serum 104  CO2, Serum  35  Calcium (Total), Serum  8.3  Osmolality (calc) 285  eGFR (African American) >60  eGFR (Non-African American) >60 (eGFR values <53m/min/1.73 m2 may be an indication of chronic kidney disease (CKD). Calculated eGFR, using the MRDR Study equation, is useful in  patients with stable renal function. The eGFR calculation will not be reliable in acutely ill patients when serum creatinine is changing rapidly. It is not useful in patients on dialysis. The eGFR calculation may not be applicable to patients at the low and high extremes of body sizes, pregnant women, and vegetarians.)  Anion Gap  4  B-Type Natriuretic Peptide ([facility])  2170 (Result(s) reported on  02 Apr 2014 at 05:29AM.)  Cholesterol, Serum 163  Triglycerides, Serum 136  HDL (INHOUSE) 45  VLDL Cholesterol Calculated 27  LDL Cholesterol Calculated 91 (Result(s) reported on 02 Apr 2014 at 05:29AM.)  Magnesium, Serum 2.1  (1.8-2.4 THERAPEUTIC RANGE: 4-7 mg/dL TOXIC: > 10 mg/dL  -----------------------)  Routine Hem:  19-Dec-15 04:24   WBC (CBC) 8.0  RBC (CBC) 4.58  Hemoglobin (CBC) 12.5  Hematocrit (CBC) 39.9  Platelet Count (CBC) 304  MCV 87  MCH 27.3  MCHC  31.3  RDW  15.4  Neutrophil % 69.6  Lymphocyte % 17.1  Monocyte % 9.2  Eosinophil % 3.5  Basophil % 0.6  Neutrophil # 5.5  Lymphocyte # 1.4  Monocyte # 0.7  Eosinophil # 0.3  Basophil # 0.0 (Result(s) reported on 02 Apr 2014 at 05:26AM.)   Medical Imaging Results:   Review Medical Imaging   Chest With Contrast 01-Apr-2014 10:08:00: IMPRESSION:  1. Moderate to large sized right-sided hydro pneumothorax with near  complete consolidation of the right middle and lower lobes with  impaction within the right bronchus intermedius.  2. Mediastinal lymphadenopathy worrisome for metastatic disease.  Note, given near complete consolidation of the right middle and  lower lobes, an underlying pulmonary malignancy is not excluded on  the basis of this examination. Given the apparent obstruction of the  right bronchus intermedius, further evaluation with bronchoscopy  could be performed as clinically indicated.  3. Indeterminate approximately 2.2 cm nodule within right adrenal  gland, incompletely evaluated on the present examination, and as  such, metastatic disease is not excluded.  4. Cardiomegaly with development of a moderate to large-sized  pericardial effusion. Further evaluation with cardiac echo could be  performed as clinically indicated.  5. Fusiform aneurysmal dilatation of the ascending thoracic aorta  measuring 4 cm in maximal diameter.  6. Moderate-sized hiatal hernia. The esophagus appears patulous and  slightly thick walled throughout its course, primarily distally.  Further evaluation with endoscopy could be performed as clinically  indicated.  7. Indeterminate approximately 3 cm hypo attenuating mass within the  left lobe of the thyroid. Further  evaluation with nonemergent  dedicated thyroid ultrasound could be performed as clinically  indicated.  I attempted to contact the ordering physician, Dr. Army Melia, however  despite prolonged efforts, I was unable to reach her. As such, the  patient was escorted directly to the [facility] emergency department for further evaluation and management.      Electronically Signed    By: Sandi Mariscal M.D.    On: 04/01/2014 11:11         Verified By: Aileen Fass, M.D., Portable Single View 01-Apr-2014 14:15:00: IMPRESSION:  Interval right chest tube placement withre expansion of the right  lung and evacuation of the right-sided pneumothorax. Stable  right-sided pleural effusion.      Electronically Signed    By: Camie Patience M.D.    On: 04/01/2014 14:27     Verified By: Vivia Ewing, M.D.,  Assessment and Plan: Impression:   Pleural effusion with mediastinal lymphadenopathy concerning for underlying malignancy. Plan:   1. Pleural effusion: Patient had drainage with chest tube placement yesterday with improvement of her symptoms. Fluid was sent for cytology to assess for underlying malignancy. CT scan as above. If cytology from fluid is inconclusive, can consider bronchoscopy in the near future to obtain a definitive diagnosis. PET scan would not be helpful at this point given her recent history of pneumonia. This  is highly unlikely recurrent breast cancer after over 30 years. If malignancy is confirmed, will further discuss additional staging and treatment options. consult, will follow.  Electronic Signatures: Delight Hoh (MD)  (Signed 19-Dec-15 15:41)  Authored: Note Type, CC/HPI, Review of Systems, ALLERGIES, Patient Family Social History, HOME MEDICATIONS, Vital Signs, Physical Exam, Lab Results Review, Rad Results Review, Assessment and Plan   Last Updated: 19-Dec-15 15:41 by Delight Hoh (MD)

## 2014-08-06 NOTE — Consult Note (Signed)
PATIENT NAME:  Claire Roach, [name] MR#:  [id number] DATE OF BIRTH:  11-22-1929  DATE OF CONSULTATION:  04/01/2014  REFERRING PHYSICIAN:  Governor Rooksebecca Lord, MD CONSULTING PHYSICIAN:  Sheppard Plumberimothy E. Omarion Minnehan, MD  INDICATION FOR CONSULTATION: Right-sided tension pneumothorax.   I have personally seen and examined Claire Roach. I have reviewed her x-rays.   HISTORY OF PRESENT ILLNESS: Claire Roach is an 81 year old white female who over the last 2 months has had 2 thoracenteses for which she believes is pneumonia with a parapneumonic effusion. Most recently she thinks this was performed by Dr. Harold HedgeKenneth Fath as she was being evaluated for a myocardial infarction. At some point she was admitted to the hospital where she underwent a cardiac catheterization which did not reveal any evidence of coronary artery disease, but over the last several days she has gotten progressively more short of breath. She went to see her primary care physician where a CT scan was ordered and while in the radiology department it was noted that she had a right tension pneumothorax and she was sent to the Emergency Department for management. When I saw the patient she was on a face mask but was comfortable. She was short of breath, but was able to give most of the history. She was accompanied today by her 3 sons who also gave a lot of the history.   PAST MEDICAL HISTORY: Mostly significant for bilateral breast cancers. This was several years ago and she underwent bilateral mastectomies. The details of that are unclear. She is not taking any aspirin or any other anticoagulants.   PHYSICAL EXAMINATION: She is a thin, elderly female who is in no acute distress. She was somewhat short of breath with exertion. She was sitting in a emergency room stretcher, but appeared comfortable. Her heart rate was in the 80s to 90s. Her oxygen saturations were in the mid 90s. Her lungs showed diminished breath sounds all throughout the right  side. The left side was normal. Her heart was regular. I did not appreciate any murmurs. Her abdomen was soft and nontender. There was no peripheral edema, clubbing or cyanosis.   DIAGNOSTIC DATA: I have independently reviewed the patient's CT scan. There is a right-sided tension pneumothorax. There is some fluid there as well. The chest was drained using sterile technique using a 24-French chest tube. Immediately there was a rush of air as well as some serosanguineous fluid. This was all sent for analysis. A chest x-ray showed that the tube was in slightly far and it was pulled back approximately 6 cm and resecured.   ASSESSMENT AND RECOMMENDATIONS: The patient will be admitted to the hospital, to the medical service, and surgery will consult on the patient.   Thank you very much for allowing me to participate in her care.  ____________________________ Sheppard Plumberimothy E. Thelma Bargeaks, MD teo:sb D: 04/01/2014 14:47:00 ET T: 04/01/2014 15:12:04 ET JOB#: [id number]  cc: Marcial Pacasimothy E. Thelma Bargeaks, MD, <Dictator> Jasmine DecemberIMOTHY E Taytem Ghattas MD ELECTRONICALLY SIGNED 04/04/2014 9:08

## 2014-08-06 NOTE — Discharge Summary (Signed)
PATIENT NAME:  Claire Roach, Claire Roach MR#:  [id number] DATE OF BIRTH:  29-Feb-1930  DATE OF ADMISSION:  02/27/2014 DATE OF DISCHARGE:  03/03/2014  PRESENTING COMPLAINT: Chest pain.   DISCHARGE DIAGNOSES: 1.  Acute non-Q-wave myocardial infarction with normal coronaries on cardiac catheterization. 2.  Takotsubo cardiomyopathy/stress-induced cardiomyopathy.  3.  Left-sided pneumonia with pleural effusion, status post thoracentesis.  4.  Hypertension.   CODE STATUS: FULL code.   PROCEDURES: Cardiac cath: Normal coronaries. Echo: EF 35%. Left-sided thoracentesis:  2 liters of transudative fluid removed.  DISCHARGE MEDICATIONS: 1.  Levaquin 750 mg p.o. every 48 hours.  2.  Carvedilol 3.125 b.i.d.  3.  Aspirin 81 mg daily.  4.  Lisinopril 5 mg daily.  5.  Claritin 10 mg daily as needed.  6.  Tylenol 500 mg 1 to 2 every 6 hours as needed.   HOME HEALTH: PT and RN has been arranged.   CONSULTATIONS: Dr. Lady GaryFath.   DISCHARGE FOLLOWUP: 1.  Follow up with Dr. Bari EdwardLaura Berglund in 1 to 2 weeks.  2.  Follow up with Dr. Lady GaryFath in 2 to 3 weeks.   DIAGNOSTIC DATA: Cardiac cath showed normal coronary vessel was normal size. Angiography showed minor luminary irregularities. RCA is normal. Overall cardiac cath results were negative.   White count 9.6, H and H 11.6 and 36.3.   Ultrasound-guided thoracentesis: Removal of about 2 liters of clear fluid from the left side. Fluid analysis showed transudative fluid. No growth in 36 hours on the fluid.   Echo showed EF of 35% to 40%, multiple segmental abnormalities exist. Moderate pleural effusion in the right lateral region. Trivial pericardial effusion. Mild to moderate MR.   White count on admission was 15.3. Troponin maxed out to 2.8.   BRIEF SUMMARY OF HOSPITAL COURSE: Claire Roach is an 81 year old Caucasian female with past medical history of hypertension who was recently diagnosed with pneumonia, started on Z-Pak, comes in with burning chest pain,  found to have elevated troponin. She was admitted with:  1.  Acute non-Q-wave MI, initial episode. She was started on heparin drip, aspirin, statins, metoprolol and nitroglycerin. Cardiology consultation was placed with Fath who performed cardiac cath which showed normal coronaries. It was assumed the patient has stressed induced cardiomyopathy/Takotsubo cardiomyopathy. The patient will continue Coreg and lisinopril along with aspirin.  2.  Community-acquired pneumonia, right-sided, with parapneumonic effusion, status post 2 liters of thoracentesis. The patient's sats were 97% on room air prior to discharge. She will complete a course of Levaquin. Her white count was 15,000 on admission, prior to was 9.6. Fluid appeared transudative. No growth to far.  3.  Essential hypertension. Continue Coreg and lisinopril.  4.  Hyperglycemia. Does not have history of diabetes. A1c is 6. Sugars remain stable.   Physical therapy saw the patient and recommends home health PT which has been arranged.   CODE STATUS: No code, DNR.   Discharge plan was discussed with the patient and family.   TIME SPENT: 40 minutes. ____________________________ Wylie HailSona A. Allena KatzPatel, MD sap:sb D: 03/04/2014 07:50:55 ET T: 03/04/2014 14:09:54 ET JOB#: [id number]  cc: Mattew Chriswell A. Allena KatzPatel, MD, <Dictator> Willow OraSONA A Kaya Klausing MD ELECTRONICALLY SIGNED 03/17/2014 14:06

## 2014-08-10 NOTE — Consult Note (Signed)
PATIENT NAME:  Claire Roach, Claire Roach MR#:  [id number] DATE OF BIRTH:  1930-01-18  DATE OF CONSULTATION:  04/02/2014  PRIMARY CARE PHYSICIAN:  Oneita Kras, MD  REFERRING PHYSICIAN:  Shaune Pollack, MD CONSULTING PHYSICIAN:  Dwayne D. Juliann Pares, MD  PRIMARY CARE PHYSICIAN: Dr. Imogene Burn.   INDICATION: Shortness of breath, pericardial effusion, in addition a large pleural effusion.  HISTORY OF PRESENT ILLNESS: The patient is an 81 year old white female with history of  myocardial infarction  cardiomyopathy, pneumonia. Symptoms  by primary care physician for the worsening shortness of breath,  that worsened the last 2 days. The patient reports mild shortness of breath, but it got progressively worse. She had a myocardial infarction about a month ago treated by Dr.   which showed normal coronaries, and thought that she had Takotsubo and was treated medically. The patient had right-sided pneumonia and treated with antibiotics. The patient also had a pleural effusion on the right side, got a thoracentesis, 2 liters of fluid. That study reportedly was negative.  She started to feel weak and more short of breath  came to the Emergency Room with no fevers, chills, or sweats.  Found to have a significant large pleural effusion on CT with evidence of a smaller pericardial effusion. Chest tube was placed and 700 mL of bloody fluid was obtained.    The patient now states she feels better, is less short of breath.  No significant chest pain except where the chest tube is.   PAST MEDICAL HISTORY: Myocardial infarction, pneumonia, right-sided pleural effusion, hypertension, breast cancer, Takotsubo,    PAST SURGICAL HISTORY:  Bilateral mastectomy, breast cancer.  SOCIAL HISTORY: No smoking, alcohol consumption, retired.   FAMILY HISTORY: Hypertension.   ALLERGIES: None.   MEDICATIONS: Tylenol 500 1-2 tablets every 6 hours, mirtazapine 15 mg at bedtime, lisinopril 5 mg daily, Flonase  mcg once a day, Coreg 3.125  daily, aspirin 81 mg a day.   REVIEW OF SYSTEMS:  No blackout spells or syncope. No nausea, vomiting. Denies fever, chills, sweats. No weight loss or weight gain. No hemoptysis or hematemesis. No bright red blood per rectum.  She has had shortness of breath, weakness, fatigue, recent myocardial infarction, which was thought to be Takotsubo and pleural  effusion of unclear etiology, possibly from heart failure.   DIAGNOSTIC DATA:  Chest x-ray shows chest tube in place with evacuation of large pleural effusion on the right. Pleural effusion tested with LDH of 277, amylase 30, glucose 106. The patient's CBC normal limits. Glucose 108. BUN of 16, creatinine 0.72. Electrolytes were normal. Troponin 0.03. CAT scan shows tube placement with 2.2 nodule in the right adrenal gland, mediastinal adenopathy worrisome for metastatic disease. Cardiomegaly with large pericardial effusion on CT. Would recommend echocardiogram for evaluation.   ASSESSMENT: Right pleural effusion. She has had what looks like a tension pneumothorax and chest tube placement, pericardial effusion, possibly malignant metastasis from breast, right renal adrenal mass. Abdominal aneurysm dilatation of the ascending aorta, hiatal hernia, thyroid mass, cardiomegaly with pericardial effusion, history of myocardial .  PLAN: Agree with chest tube placement. Agree with fluid analysis of possible metastatic cell disease. Recommend echocardiogram for pericardial effusion. She may need that tapped if she has further symptoms.  She had a moderate to severe cardiomyopathy in the past on lisinopril and Coreg. Recommend cancer workup. Continue hypertension control. Continue chest tube placement until no longer necessary. She may need a pericardial window. May need oncology evaluation, MRI or PET scan for further evaluation of  possible metastatic breast disease. At this point do not recommend pericardial tap. Continue to diurese the patient, but continue with  chest tube drainage.  We will see how the patient responds.    ____________________________ Bobbie Stackwayne D. Juliann Paresallwood, MD ddc:LT D: 04/03/2014 11:41:00 ET T: 04/03/2014 17:04:02 ET JOB#: [id number]  cc: Dwayne D. Juliann Paresallwood, MD, <Dictator> Alwyn PeaWAYNE D CALLWOOD MD ELECTRONICALLY SIGNED 05/04/2014 13:57

## 2014-08-14 NOTE — Consult Note (Signed)
Chief Complaint:  Subjective/Chief Complaint Patient resting quietly in bed chest tube in place still short of breath but slightly improved. no fever chills or sweats no evidence of bleeding.   VITAL SIGNS/ANCILLARY NOTES: **Vital Signs.:   20-Dec-15 14:03  Vital Signs Type Recheck  Pulse Pulse 82  Systolic BP Systolic BP 287  Diastolic BP (mmHg) Diastolic BP (mmHg) 69  Mean BP 81  *Intake and Output.:   20-Dec-15 14:20  Grand Totals Intake:   Output:  150    Net:  -150 24 Hr.:  -10  Urine ml     Out:  150  Urinary Method  Void; BSC   Brief Assessment:  GEN well developed, well nourished, no acute distress, cachectic, thin, critically ill appearing   Cardiac Irregular  murmur present  -- LE edema  -- JVD   Respiratory normal resp effort  clear BS  rhonchi   Gastrointestinal Normal   Gastrointestinal details normal Soft   EXTR negative cyanosis/clubbing, negative edema   Additional Physical Exam chest tube in place  on the right side   Lab Results: Hepatic:  19-Dec-15 04:24   Bilirubin, Total 0.4  Alkaline Phosphatase 82 (46-116 NOTE: New Reference Range 11/02/13)  SGPT (ALT) 31 (14-63 NOTE: New Reference Range 11/02/13)  SGOT (AST) 32  Total Protein, Serum  6.1  Albumin, Serum  2.2  Oncology:  19-Dec-15 04:24   CA 27.29 (Serial Monitoring)  52.0 [facility]/ACS methodology            [place]            No: [phone]           [address], [place], [place] [address]           Lindon Romp, MD         [phone] Result(s) reported on 04 Apr 2014 at 05:02PM.)  Routine Chem:  19-Dec-15 04:24   Glucose, Serum 90  BUN 15  Creatinine (comp) 0.87  Sodium, Serum 143  Potassium, Serum 4.1  Chloride, Serum 104  CO2, Serum  35  Calcium (Total), Serum  8.3  Osmolality (calc) 285  eGFR (African American) >60  eGFR (Non-African American) >60 (eGFR values <34m/min/1.73 m2 may be an indication of chronic kidney disease  (CKD). Calculated eGFR, using the MRDR Study equation, is useful in  patients with stable renal function. The eGFR calculation will not be reliable in acutely ill patients when serum creatinine is changing rapidly. It is not useful in patients on dialysis. The eGFR calculation may not be applicable to patients at the low and high extremes of body sizes, pregnant women, and vegetarians.)  Anion Gap  4  B-Type Natriuretic Peptide ([facility])  2170 (Result(s) reported on 02 Apr 2014 at 05:29AM.)  Cholesterol, Serum 163  Triglycerides, Serum 136  HDL (INHOUSE) 45  VLDL Cholesterol Calculated 27  LDL Cholesterol Calculated 91 (Result(s) reported on 02 Apr 2014 at 05:29AM.)  Magnesium, Serum 2.1 (1.8-2.4 THERAPEUTIC RANGE: 4-7 mg/dL TOXIC: > 10 mg/dL  -----------------------)  Routine Hem:  19-Dec-15 04:24   WBC (CBC) 8.0  RBC (CBC) 4.58  Hemoglobin (CBC) 12.5  Hematocrit (CBC) 39.9  Platelet Count (CBC) 304  MCV 87  MCH 27.3  MCHC  31.3  RDW  15.4  Neutrophil % 69.6  Lymphocyte % 17.1  Monocyte % 9.2  Eosinophil % 3.5  Basophil % 0.6  Neutrophil # 5.5  Lymphocyte # 1.4  Monocyte # 0.7  Eosinophil # 0.3  Basophil #  0.0 (Result(s) reported on 02 Apr 2014 at 05:26AM.)   Radiology Results: XRay:    18-Dec-15 14:15, Chest Portable Single View  Chest Portable Single View   REASON FOR EXAM:    post chest tube insertion  COMMENTS:       PROCEDURE: DXR - DXR PORTABLE CHEST SINGLE VIEW  - Apr 01 2014  2:15PM     CLINICAL DATA:  Chest tube placement right-sided hydro pneumothorax.  Persistent shortness breath.    EXAM:  PORTABLE CHEST - 1 VIEW    COMPARISON:  CT 04/01/2014.  Radiographs 03/25/2014.    FINDINGS:  1402 hr. Interval right chest tube placement. The tip of the tube  overlies the aortic arch. The right-sided pneumothorax has been  evacuated. There is a persistent moderate size right pleural  effusion. There is partial expansion of the right lung with  improved  aeration of the right lung base. The left lung is clear.  Cardiomegaly appears unchanged.     IMPRESSION:  Interval right chest tube placement withre expansion of the right  lung and evacuation of the right-sided pneumothorax. Stable  right-sided pleural effusion.      Electronically Signed    By: Camie Patience M.D.    On: 04/01/2014 14:27     Verified By: Vivia Ewing, M.D.,    19-Dec-15 14:26, Chest Portable Single View  Chest Portable Single View   REASON FOR EXAM:    right pleural effusioin, s/p chest tube.  COMMENTS:       PROCEDURE: DXR - DXR PORTABLE CHEST SINGLE VIEW  - Apr 02 2014  2:26PM     CLINICAL DATA:  Right pleural effusion, chest tube    EXAM:  PORTABLE CHEST - 1 VIEW    COMPARISON:  Radiograph 04/01/2014    FINDINGS:  Stable enlarged cardiac silhouette. There is a chronic right  effusion. Right chest tube in place. Small right apical pneumothorax  measures 3 mm from chest wall unchanged from prior.     IMPRESSION:  1. No interval change.  2. Right pleural effusion with small pneumothorax in chest tube in  place.  3. Marked cardiomegaly      Electronically Signed    By: Suzy Bouchard M.D.    On: 04/02/2014 16:32         Verified By: Rennis Golden, M.D.,    21-Dec-15 10:19, Chest Portable Single View  Chest Portable Single View   REASON FOR EXAM:    assess for pleural effusion  COMMENTS:       PROCEDURE: DXR - DXR PORTABLE CHEST SINGLE VIEW  - Apr 04 2014 10:19AM     CLINICAL DATA:  Chest tube.    EXAM:  PORTABLE CHEST - 1 VIEW    COMPARISON:  04/02/2014.    FINDINGS:  Right chest tube in stable position. Mediastinum and hilar  structures are normal. Right lower lobe atelectasis and/or  infiltrate again noted. Right pleural effusion again noted. No  pneumothorax. Stable severe cardiomegaly with normal pulmonary  vascularity. No acute bony abnormality P     IMPRESSION:  1. Right chest tube in stable position.  No  pneumothorax 2  2. Right lower lobe atelectasis and/or infiltrate with right pleural  effusion. No change from prior exam.  3. Stable severe cardiomegaly.  Pulmonary vascularity is normal.      Electronically Signed    By: Marcello Moores  Register    On: 04/04/2014 10:48     Verified By: Osa Craver,  M.D., MD    23-Dec-15 13:14, Chest PA and Lateral  Chest PA and Lateral   REASON FOR EXAM:    visualize Rt side chest tube  COMMENTS:   May transport without cardiac monitor    PROCEDURE: DXR - DXR CHEST PA (OR AP) AND LATERAL  - Apr 06 2014  1:14PM     CLINICAL DATA:  pnuemonia; fluid on lungs; chest tube-right side    EXAM:  CHEST  2 VIEW    COMPARISON:  04/04/2014    FINDINGS:  Right-sided chest tube is stable. Right pleural effusion obscures  the right hemidiaphragm and part of the right heart border. This is  similar to the recent prior study allowing for differences in  patient positioning. There is additional right lung base opacity,  likely atelectasis.    No left pleural effusion.  No pulmonary edema.  No pneumothorax.    Cardiac silhouette is mildly enlarged.  Aorta is tortuous.     IMPRESSION:  1. Right-sided chest is stable.  No pneumothorax.  2. Right pleural effusion associated lung base atelectasis is  similar to the prior exam allowing for differences in technique and  patient positioning. No new abnormalities.    Electronically Signed    By: Lajean Manes M.D.    On: 04/06/2014 13:27         Verified By: Lasandra Beech, M.D.,    25-Dec-15 09:01, Chest PA and Lateral  Chest PA and Lateral   REASON FOR EXAM:    assess for pleural effusion  COMMENTS:       PROCEDURE: DXR - DXR CHEST PA (OR AP) AND LATERAL  - Apr 08 2014  9:01AM     CLINICAL DATA:  Assess the pleural effusion and chest tube.    EXAM:  CHEST  2 VIEW    COMPARISON:  04/06/2014    FINDINGS:  Stable position of the right chest drain. There are residual pleural  and parenchymal  densities at the the base of the right chest. Heart  size remains enlarged. No evidence for a large pneumothorax.     IMPRESSION:  Stable position of the right chest tube without a large  pneumothorax. Difficult to exclude a tiny amount of pleural air near  the right lung apex.    Persistent pleural and parenchymal densities in the right lower  chest. Pleural densities could represent pleural fluid and/or  pleural thickening. Minimal change from previous examination.      Electronically Signed    By: Markus Daft M.D.    On: 04/08/2014 09:29     Verified By: Burman Riis, M.D.,    26-Dec-15 12:44, Chest PA and Lateral  Chest PA and Lateral   REASON FOR EXAM:    chest tube  COMMENTS:       PROCEDURE: DXR - DXR CHEST PA (OR AP) AND LATERAL  - Apr 09 2014 12:44PM     CLINICAL DATA:  Pneumonia.  Chest tube.    EXAM:  CHEST  2 VIEW    COMPARISON:  04/08/2014    FINDINGS:  Severe cardiomegaly.Right chest tube in place. Right pleural  effusion. Heterogeneous opacities in the right mid and lower lung  zone. Tiny right apical pneumothorax. Left lung remains clear.     IMPRESSION:  Tiny right apical pneumothorax with the right chest tube in place    Right pleural effusion and basilar airspace opacities are stable.      Electronically Signed    By:  Art  Hoss M.D.    On: 04/09/2014 14:17         Verified By: Jamas Lav, M.D.,  Cardiology:    18-Dec-15 10:43, ECG  Ventricular Rate 84  Atrial Rate 84  P-R Interval 154  QRS Duration 90  QT 376  QTc 444  P Axis 75  R Axis 4  T Axis 140  ECG interpretation   Normal sinus rhythm  ST & T wave abnormality, consider lateral ischemia  Abnormal ECG  When compared with ECG of 27-Feb-2014 11:21,  Previous ECG has undetermined rhythm, needs review  Criteria for Anteroseptal infarct are no longer Present  ST now depressed in Lateral leads  T wave inversion more evident in Lateral  leads  ----------unconfirmed----------  Confirmed by OVERREAD, NOT ([id number]), editor PEARSON, BARBARA ([id number]) on 04/04/2014 12:53:18 PM  ECG     21-Dec-15 02:56, ECG  Ventricular Rate 132  Atrial Rate 136  QRS Duration 88  QT 336  QTc 497  R Axis 27  T Axis 106  ECG interpretation   Atrial fibrillation with rapid ventricular response with premature ventricular or aberrantly conducted complexes  Possible Anterior infarct , age undetermined  Abnormal ECG  When compared with ECG of 01-Apr-2014 10:43,  Atrial fibrillation has replaced Sinus rhythm  Vent. rate has increased BY  48 BPM  T wave inversion no longer evident in Lateral leads  Confirmed by PARASCHOS, ALEX ([id number]) on 04/04/2014 5:36:05 PM    Overreader: PARASCHOS, ALEX  ECG   CT:    26-Dec-15 15:14, CT Chest, Abd, and Pelvis With Contrast  CT Chest, Abd, and Pelvis With Contrast   REASON FOR EXAM:    (1) pneumothorax, s/p CT placement; (2) aversion to   food;    NOTE: Nursing to Gi  COMMENTS:       PROCEDURE: CT  - CT CHEST ABDOMEN AND PELVIS W  - Apr 09 2014  3:14PM     CLINICAL DATA:  Cough. Abdominal and pelvic discomfort. Right  pleural effusion and tension pneumothorax, post chest tube  placement. History of breast cancer.    EXAM:  CT CHEST, ABDOMEN, AND PELVIS WITH CONTRAST    TECHNIQUE:  Multidetector CT imaging of the chest, abdomen and pelvis was  performed following the standard protocol during bolus  administration of intravenous contrast.    CONTRAST:  100 cc Omnipaque 300 IV.    COMPARISON:  04/01/2014    FINDINGS:  CT CHEST FINDINGS    Right chest tube remains in place. Small to moderate residual right  pleural effusion and small right pneumothorax. There is masslike  soft tissue in the right hilum and infrahilar region. The airways  are occluded and mucous filled in the right lower lobe. The soft  tissue area measures approximately 5.9 x 3.5 cm on image 33. This is  concerning for tumor. There is  associated right middle lobe and  right lower lobe atelectasis. Abnormal soft tissue extends into the  mediastinum in the subcarinal and paratracheal/pretracheal regions.  Conglomerate soft tissue mass extending from the subcarinal region  into the precarinal region measures up to 4.6 cm on image 26.    Peripheral nodular opacities are noted throughout the right upper  lobe and right lower lobe, likely infectious/inflammatory. There is  a central right upper lobe nodule on image 20 measuring 5 mm. This  nodule was not well visualized on prior study due to collapse.  Peripheral subpleural nodules are again seen scattered throughout  the left lung, unchanged. No left pleural effusion. No left hilar or  axillary adenopathy.    There is a moderate pericardial effusion, stable. Heart is mildly  enlarged. Aorta is tortuous and slightly aneurysmal in the ascending  thoracic aorta, 4 cm maximally.    No acute bony abnormality.    CT ABDOMEN AND PELVIS FINDINGS    Small hiatal hernia. Liver, spleen, pancreas, left adrenal are  unremarkable. Small cysts in the kidneys bilaterally. No  hydronephrosis.    2 cm mixed density mass within the right adrenal gland, stable.  Gallbladder is unremarkable. Uterus, adnexae and urinary bladder are  unremarkable. Stomach, large and small bowel grossly unremarkable.  No free fluid, free air or adenopathy.  No acute bony abnormality or focal bone lesion. Degenerative changes  in the lumbar spine. Fusion across the SI joints.     IMPRESSION:  Abnormal masslike soft tissue in the right hilum and infrahilar  region with associated impaction of the airways and mediastinal  adenopathy. Findings are concerning for malignancy/mass.    Small right hydro pneumothorax.  Right chest tube remains in place.    Right upper lobe nodule and scattered peripheral left lung nodules,  indeterminate. Peripheral subpleural opacities in the right lung are  likely  infectious/inflammatory.    Stable moderate pericardial effusion.  Cardiomegaly.  Right adrenal lesion, indeterminate but concerning for metastasis.      Electronically Signed    By: Rolm Baptise M.D.    On: 04/09/2014 15:29         Verified By: Raelyn Number, M.D.,   Assessment/Plan:  Assessment/Plan:  Assessment IMP  pleural effusion  pericardial effusion  coronary disease  ischemic cardiomyopathy  congestive heart failure  shortness of breath  hypotension  probable metastatic cancer  thyroid disease  adrenal mass .   Plan PLAN  continue chest tube drainage  hold diuretics and blood pressure medications because of hypotension  pericardial effusion appears to be stable   agree with Oncology improved for possible metastatic cancer  continue telemetry for antiarrhythmic  coronary disease appears to be stable  hilar hernia continue current therapy as necessary for symptoms  agree with palliative care consult   Electronic Signatures: Yolonda Kida (MD)  (Signed 21-Jan-16 15:17)  Authored: Chief Complaint, VITAL SIGNS/ANCILLARY NOTES, Brief Assessment, Lab Results, Radiology Results, Assessment/Plan   Last Updated: 21-Jan-16 15:17 by Lujean Amel D (MD)

## 2014-08-14 NOTE — Consult Note (Signed)
Chief Complaint:  Subjective/Chief Complaint The patient improve things slowly denies any pain. no evidence of bleeding chest tube still in place.   VITAL SIGNS/ANCILLARY NOTES: **Vital Signs.:   21-Dec-15 11:35  Vital Signs Type Routine  Temperature Temperature (F) 98.3  Celsius 36.8  Temperature Source oral  Pulse Pulse 89  Respirations Respirations 20  Systolic BP Systolic BP 102  Diastolic BP (mmHg) Diastolic BP (mmHg) 67  Mean BP 78  Pulse Ox % Pulse Ox % 97  Pulse Ox Activity Level  At rest  Oxygen Delivery Room Air/ 21 %  *Intake and Output.:   21-Dec-15 13:51  Grand Totals Intake:   Output:      Net:   24 Hr.:  150  Urinary Method  BSC  Stool  small amount of soft stool   Brief Assessment:  GEN well developed, well nourished, no acute distress, cachectic, thin, critically ill appearing   Cardiac Irregular  murmur present  -- LE edema  -- JVD   Respiratory normal resp effort  clear BS  rhonchi   Gastrointestinal Normal   Gastrointestinal details normal Soft   EXTR negative cyanosis/clubbing, negative edema   Additional Physical Exam chest tube in place  on the right side   Radiology Results: XRay:    18-Dec-15 14:15, Chest Portable Single View  Chest Portable Single View   REASON FOR EXAM:    post chest tube insertion  COMMENTS:       PROCEDURE: DXR - DXR PORTABLE CHEST SINGLE VIEW  - Apr 01 2014  2:15PM     CLINICAL DATA:  Chest tube placement right-sided hydro pneumothorax.  Persistent shortness breath.    EXAM:  PORTABLE CHEST - 1 VIEW    COMPARISON:  CT 04/01/2014.  Radiographs 03/25/2014.    FINDINGS:  1402 hr. Interval right chest tube placement. The tip of the tube  overlies the aortic arch. The right-sided pneumothorax has been  evacuated. There is a persistent moderate size right pleural  effusion. There is partial expansion of the right lung with improved  aeration of the right lung base. The left lung is clear.  Cardiomegaly appears  unchanged.     IMPRESSION:  Interval right chest tube placement withre expansion of the right  lung and evacuation of the right-sided pneumothorax. Stable  right-sided pleural effusion.      Electronically Signed    By: Roxy HorsemanBill  Veazey M.D.    On: 04/01/2014 14:27     Verified By: Gerrianne ScaleWILLIAM B. VEAZEY, M.D.,    19-Dec-15 14:26, Chest Portable Single View  Chest Portable Single View   REASON FOR EXAM:    right pleural effusioin, s/p chest tube.  COMMENTS:       PROCEDURE: DXR - DXR PORTABLE CHEST SINGLE VIEW  - Apr 02 2014  2:26PM     CLINICAL DATA:  Right pleural effusion, chest tube    EXAM:  PORTABLE CHEST - 1 VIEW    COMPARISON:  Radiograph 04/01/2014    FINDINGS:  Stable enlarged cardiac silhouette. There is a chronic right  effusion. Right chest tube in place. Small right apical pneumothorax  measures 3 mm from chest wall unchanged from prior.     IMPRESSION:  1. No interval change.  2. Right pleural effusion with small pneumothorax in chest tube in  place.  3. Marked cardiomegaly      Electronically Signed    By: Genevive BiStewart  Edmunds M.D.    On: 04/02/2014 16:32  Verified By: Patriciaann Clan, M.D.,    21-Dec-15 10:19, Chest Portable Single View  Chest Portable Single View   REASON FOR EXAM:    assess for pleural effusion  COMMENTS:       PROCEDURE: DXR - DXR PORTABLE CHEST SINGLE VIEW  - Apr 04 2014 10:19AM     CLINICAL DATA:  Chest tube.    EXAM:  PORTABLE CHEST - 1 VIEW    COMPARISON:  04/02/2014.    FINDINGS:  Right chest tube in stable position. Mediastinum and hilar  structures are normal. Right lower lobe atelectasis and/or  infiltrate again noted. Right pleural effusion again noted. No  pneumothorax. Stable severe cardiomegaly with normal pulmonary  vascularity. No acute bony abnormality P     IMPRESSION:  1. Right chest tube in stable position.  No pneumothorax 2  2. Right lower lobe atelectasis and/or infiltrate with right  pleural  effusion. No change from prior exam.  3. Stable severe cardiomegaly.  Pulmonary vascularity is normal.      Electronically Signed    By: Maisie Fus  Register    On: 04/04/2014 10:48     Verified By: Gwynn Burly, M.D., MD    23-Dec-15 13:14, Chest PA and Lateral  Chest PA and Lateral   REASON FOR EXAM:    visualize Rt side chest tube  COMMENTS:   May transport without cardiac monitor    PROCEDURE: DXR - DXR CHEST PA (OR AP) AND LATERAL  - Apr 06 2014  1:14PM     CLINICAL DATA:  pnuemonia; fluid on lungs; chest tube-right side    EXAM:  CHEST  2 VIEW    COMPARISON:  04/04/2014    FINDINGS:  Right-sided chest tube is stable. Right pleural effusion obscures  the right hemidiaphragm and part of the right heart border. This is  similar to the recent prior study allowing for differences in  patient positioning. There is additional right lung base opacity,  likely atelectasis.    No left pleural effusion.  No pulmonary edema.  No pneumothorax.    Cardiac silhouette is mildly enlarged.  Aorta is tortuous.     IMPRESSION:  1. Right-sided chest is stable.  No pneumothorax.  2. Right pleural effusion associated lung base atelectasis is  similar to the prior exam allowing for differences in technique and  patient positioning. No new abnormalities.    Electronically Signed    By: Amie Portland M.D.    On: 04/06/2014 13:27         Verified By: Domenic Moras, M.D.,    25-Dec-15 09:01, Chest PA and Lateral  Chest PA and Lateral   REASON FOR EXAM:    assess for pleural effusion  COMMENTS:       PROCEDURE: DXR - DXR CHEST PA (OR AP) AND LATERAL  - Apr 08 2014  9:01AM     CLINICAL DATA:  Assess the pleural effusion and chest tube.    EXAM:  CHEST  2 VIEW    COMPARISON:  04/06/2014    FINDINGS:  Stable position of the right chest drain. There are residual pleural  and parenchymal densities at the the base of the right chest. Heart  size remains enlarged. No  evidence for a large pneumothorax.     IMPRESSION:  Stable position of the right chest tube without a large  pneumothorax. Difficult to exclude a tiny amount of pleural air near  the right lung apex.    Persistent pleural and  parenchymal densities in the right lower  chest. Pleural densities could represent pleural fluid and/or  pleural thickening. Minimal change from previous examination.      Electronically Signed    By: Richarda Overlie M.D.    On: 04/08/2014 09:29     Verified By: Arn Medal, M.D.,    26-Dec-15 12:44, Chest PA and Lateral  Chest PA and Lateral   REASON FOR EXAM:    chest tube  COMMENTS:       PROCEDURE: DXR - DXR CHEST PA (OR AP) AND LATERAL  - Apr 09 2014 12:44PM     CLINICAL DATA:  Pneumonia.  Chest tube.    EXAM:  CHEST  2 VIEW    COMPARISON:  04/08/2014    FINDINGS:  Severe cardiomegaly.Right chest tube in place. Right pleural  effusion. Heterogeneous opacities in the right mid and lower lung  zone. Tiny right apical pneumothorax. Left lung remains clear.     IMPRESSION:  Tiny right apical pneumothorax with the right chest tube in place    Right pleural effusion and basilar airspace opacities are stable.      Electronically Signed    By: Maryclare Bean M.D.    On: 04/09/2014 14:17         Verified By: Donavan Burnet, M.D.,  Cardiology:    18-Dec-15 10:43, ECG  Ventricular Rate 84  Atrial Rate 84  P-R Interval 154  QRS Duration 90  QT 376  QTc 444  P Axis 75  R Axis 4  T Axis 140  ECG interpretation   Normal sinus rhythm  ST & T wave abnormality, consider lateral ischemia  Abnormal ECG  When compared with ECG of 27-Feb-2014 11:21,  Previous ECG has undetermined rhythm, needs review  Criteria for Anteroseptal infarct are no longer Present  ST now depressed in Lateral leads  T wave inversion more evident in Lateral leads  ----------unconfirmed----------  Confirmed by OVERREAD, NOT ([id number]), editor PEARSON, BARBARA ([id number]) on 04/04/2014  12:53:18 PM  ECG     21-Dec-15 02:56, ECG  Ventricular Rate 132  Atrial Rate 136  QRS Duration 88  QT 336  QTc 497  R Axis 27  T Axis 106  ECG interpretation   Atrial fibrillation with rapid ventricular response with premature ventricular or aberrantly conducted complexes  Possible Anterior infarct , age undetermined  Abnormal ECG  When compared with ECG of 01-Apr-2014 10:43,  Atrial fibrillation has replaced Sinus rhythm  Vent. rate has increased BY  48 BPM  T wave inversion no longer evident in Lateral leads  Confirmed by PARASCHOS, ALEX ([id number]) on 04/04/2014 5:36:05 PM    Overreader: PARASCHOS, ALEX  ECG   CT:    26-Dec-15 15:14, CT Chest, Abd, and Pelvis With Contrast  CT Chest, Abd, and Pelvis With Contrast   REASON FOR EXAM:    (1) pneumothorax, s/p CT placement; (2) aversion to   food;    NOTE: Nursing to Gi  COMMENTS:       PROCEDURE: CT  - CT CHEST ABDOMEN AND PELVIS W  - Apr 09 2014  3:14PM     CLINICAL DATA:  Cough. Abdominal and pelvic discomfort. Right  pleural effusion and tension pneumothorax, post chest tube  placement. History of breast cancer.    EXAM:  CT CHEST, ABDOMEN, AND PELVIS WITH CONTRAST    TECHNIQUE:  Multidetector CT imaging of the chest, abdomen and pelvis was  performed following the standard protocol during bolus  administration  of intravenous contrast.    CONTRAST:  100 cc Omnipaque 300 IV.    COMPARISON:  04/01/2014    FINDINGS:  CT CHEST FINDINGS    Right chest tube remains in place. Small to moderate residual right  pleural effusion and small right pneumothorax. There is masslike  soft tissue in the right hilum and infrahilar region. The airways  are occluded and mucous filled in the right lower lobe. The soft  tissue area measures approximately 5.9 x 3.5 cm on image 33. This is  concerning for tumor. There is associated right middle lobe and  right lower lobe atelectasis. Abnormal soft tissue extends into the  mediastinum in  the subcarinal and paratracheal/pretracheal regions.  Conglomerate soft tissue mass extending from the subcarinal region  into the precarinal region measures up to 4.6 cm on image 26.    Peripheral nodular opacities are noted throughout the right upper  lobe and right lower lobe, likely infectious/inflammatory. There is  a central right upper lobe nodule on image 20 measuring 5 mm. This  nodule was not well visualized on prior study due to collapse.  Peripheral subpleural nodules are again seen scattered throughout  the left lung, unchanged. No left pleural effusion. No left hilar or  axillary adenopathy.    There is a moderate pericardial effusion, stable. Heart is mildly  enlarged. Aorta is tortuous and slightly aneurysmal in the ascending  thoracic aorta, 4 cm maximally.    No acute bony abnormality.    CT ABDOMEN AND PELVIS FINDINGS    Small hiatal hernia. Liver, spleen, pancreas, left adrenal are  unremarkable. Small cysts in the kidneys bilaterally. No  hydronephrosis.    2 cm mixed density mass within the right adrenal gland, stable.  Gallbladder is unremarkable. Uterus, adnexae and urinary bladder are  unremarkable. Stomach, large and small bowel grossly unremarkable.  No free fluid, free air or adenopathy.  No acute bony abnormality or focal bone lesion. Degenerative changes  in the lumbar spine. Fusion across the SI joints.     IMPRESSION:  Abnormal masslike soft tissue in the right hilum and infrahilar  region with associated impaction of the airways and mediastinal  adenopathy. Findings are concerning for malignancy/mass.    Small right hydro pneumothorax.  Right chest tube remains in place.    Right upper lobe nodule and scattered peripheral left lung nodules,  indeterminate. Peripheral subpleural opacities in the right lung are  likely infectious/inflammatory.    Stable moderate pericardial effusion.  Cardiomegaly.  Right adrenal lesion, indeterminate but  concerning for metastasis.      Electronically Signed    By: Charlett Nose M.D.    On: 04/09/2014 15:29         Verified By: Cyndie Chime, M.D.,   Assessment/Plan:  Assessment/Plan:  Assessment IMP  tachycardia/ palpitations  pleural effusion  pericardial effusion  coronary disease  ischemic cardiomyopathy  congestive heart failure  shortness of breath  hypotension  probable metastatic cancer  thyroid disease  adrenal mass .   Plan PLAN  continue current therapy with mild fluid hydration for hypertension  continue chest tube drainage  hold diuretics and blood pressure medications because of hypotension  pericardial effusion appears to be stable   agree with Oncology improved for possible metastatic cancer  continue telemetry for antiarrhythmic  coronary disease appears to be stable  hilar hernia continue current therapy as necessary for symptoms  agree with palliative care consult   Electronic Signatures: Alwyn Pea (MD)  (  Signed 21-Jan-16 15:22)  Authored: Chief Complaint, VITAL SIGNS/ANCILLARY NOTES, Brief Assessment, Radiology Results, Assessment/Plan   Last Updated: 21-Jan-16 15:22 by Dorothyann Peng D (MD)

## 2014-08-14 NOTE — Discharge Summary (Signed)
PATIENT NAME:  Claire Roach, Claire Roach MR#:  [id number] DATE OF BIRTH:  Sep 20, 1929  DATE OF ADMISSION:  04/01/2014 DATE OF DISCHARGE:  04/12/2014  ADMIT DIAGNOSES: 1.  Right-sided pleural effusion and tension pneumothorax status post chest tube placement.  2.  Pericardial effusion.  3.  Possible malignancy and metastasis with history of breast cancer.  DISCHARGE DIAGNOSES: 1.  Right-sided pleural effusion and tension pneumothorax.  2.  Acute respiratory failure with hypoxia due to pleural effusion as well as tension pneumothorax status post chest tube placement.  3.  Pericardial effusion suspected due to metastasis.  4.  Suspected metastatic carcinomatosis with history of breast cancer. The patient refused bronchoscopy and tissue biopsy.  5.  Severe protein and calorie malnutrition.  6.  Right adrenal gland mass, questionable metastasis. 7.  Aneurysmal dilatation of ascending thoracic aorta.  8.  Hiatal hernia.  9.  Thyroid mass.  10.  Cardiomegaly with possible congestive heart failure with ejection fraction of 35%.  11.   CAD 12.  History of myocardial infarction.  13.  History of atrial fibrillation with intermittent sinus rhythm.  14.  Generalized weakness.   DISCHARGE CONDITION: Poor.   DISCHARGE MEDICATIONS: The patient is being discharged on Tylenol 500 mg p.o. 1 tablet every 6 hours as needed, aspirin 81 mg p.o. daily, Remeron 15 mg p.o. at bedtime, Flonase 1 spray once daily, morphine 10 mg in 1 mL oral concentrate 0.25 mL orally every 1 to 2 hours as needed, and Ativan 0.5 mg 3 times daily as needed. The patient is not to take lisinopril, Coreg.   CONSULTANTS: Care management, social work, Dr. Juliann Pulse,  Dr. Harvie Junior, Dr. Excell Seltzer, Dr. Thelma Barge, Dr. Anda Kraft, Dr. Orlie Dakin, Dr. Juliann Pares.   RADIOLOGIC STUDIES: Chest x-ray, portable single view 04/01/2014, showed interval right chest tube placement with re-expansion of right lung and evacuation of right-sided pneumothorax, stable  right-sided pleural effusion. Chest, portable single view 04/02/2014, showed no interval change, right pleural effusion with small pneumothorax with chest tube in place plus cardiomegaly. Chest x-ray, portable single view 04/04/2014, showed the right chest tube in stable position, no pneumothorax, right lower lobe atelectasis and/or infiltrate with right pleural effusion, no change from prior exam, stable severe cardiomegaly, pulmonary vascularity is normal. Chest x-ray, PA and lateral 04/05/2014, revealing right-sided chest is stable. No pneumothorax. Right pleural effusion associated with lung base atelectasis similar to prior exam allowing for differences in technique and patient positioning. No new abnormalities were found. Chest x-ray, PA and lateral 04/08/2014, showed stable position of right chest tube without large pneumothorax. Difficult to exclude tiny amount of pleural air space near right lung apex, persistent pleural and parenchymal densities in the right lower chest. Pleural densities could represent pleural fluid and/or pleural thickening. Minimal change from prior examination. Chest x-ray, PA and lateral 04/09/2014, showed tiny right apical pneumothorax with a right chest tube in place, right pleural effusion, and basilar air-space opacities are stable. CT of chest, abdomen and pelvis with contrast 04/09/2014 revealing abnormal mass-like soft tissue in the right hilum and infrahilar region with associated impaction of the airways and mediastinal adenopathy. Findings are concerning for malignancy or mass. A small right hydropneumothorax. Right chest tube remains in place. Right upper lobe nodule and scattered peripheral left lung nodules indeterminate. Peripheral subpleural opacities in the right lung, likely infectious inflammatory. Stable moderate pericardial effusion, cardiomegaly. Right adrenal lesion indeterminate, but concerning for metastasis.   HISTORY: Patient is an 81 year old Caucasian  female with a history of breast carcinoma in  the past who presents to the hospital with worsening shortness of breath over the past few weeks, worse over the past 2 days. Please refer to Dr. Nicky Pughhen's admission note on 04/01/2014. Apparently, the patient was admitted to the hospital recently for right-sided pleural effusion. She had thoracentesis and they drained 2 L of fluid. She was discharged. However, since discharge she has been feeling weak, more short of breath, and her symptoms worsened. In PCP office, she was noted to be tachypneic and had a CT scan of the chest, which showed the right-sided pleural effusion and pericardial effusion. The patient was evaluated by Dr. Thelma Bargeaks and had a right chest tube placed which drained approximately 700 mL of bloody fluid. The patient was admitted to the hospital for further evaluation and consulted by Dr. Thelma Bargeaks. Consultations with Dr. Juliann Paresallwood as well as Dr. Anda KraftMarterre and Dr. Orlie DakinFinnegan were obtained. The patient was followed by numerous physicians while she was in the hospital; however, she did not improve significantly and palliative care became involved due to patient's poor oral intake. Patient's family discussed with patient and palliative care and made the decision about hospice home admission on 04/12/2014, where she will be discharged. On the day of discharge, the patient did not complain of any significant discomfort or shortness of breath.  VITAL SIGNS: Her vitals were stable. Temperature 97.6, pulse was 89, respiration rate was 18, blood pressure 104/66, saturation was 94% to 97% on 0.5 L of oxygen per nasal cannula at rest.   TIME SPENT: 40 minutes.    ____________________________ Katharina Caperima Christen Bedoya, MD rv:ST D: 04/18/2014 16:22:13 ET T: 04/18/2014 23:20:35 ET JOB#: [id number]  cc: Katharina Caperima Georgio Hattabaugh, MD, <Dictator> Ilias Stcharles MD ELECTRONICALLY SIGNED 04/28/2014 10:21

## 2015-03-31 IMAGING — CT CT CHEST W/ CM
2 of 3 series · 12 of 36 positions shown, 15 images · IV contrast (omnipaque)
Comparison: Chest radiograph - 03/25/2014; 03/01/2014; 02/27/2014;
ultrasound-guided right-sided thoracentesis- 03/25/2014

CLINICAL DATA: Pulmonary effusion

EXAM:
CT CHEST WITH CONTRAST
TECHNIQUE: Multidetector CT imaging of the chest was performed during
intravenous contrast administration.
CONTRAST:  75 cc Omnipaque 300

[Series 2: routine chest with · axial · 0.55mm/px · z∈[-202,+82]mm · 9 of 69 slices shown, 12 images]
[im 6/69  mediastinal]
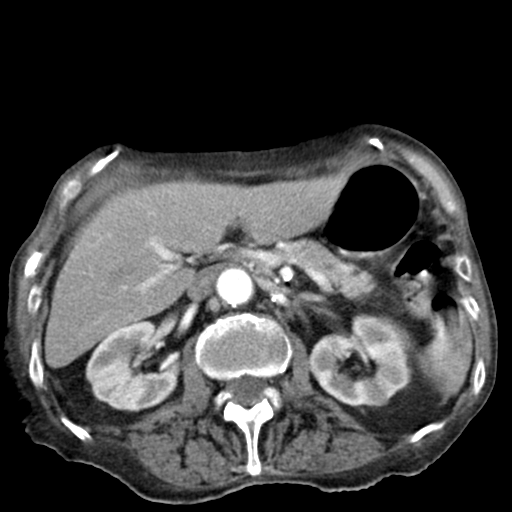
[im 6/69  lung]
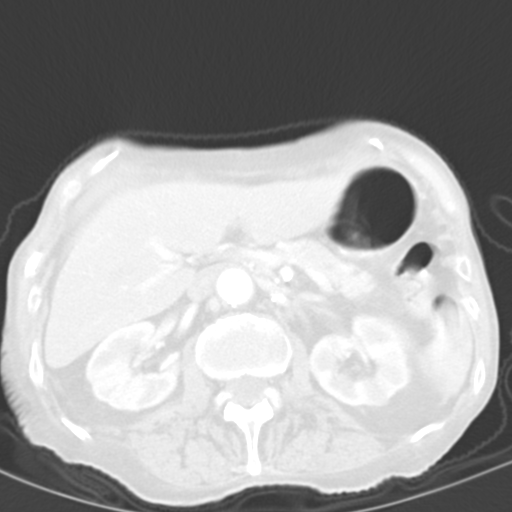
[im 13/69  lung]
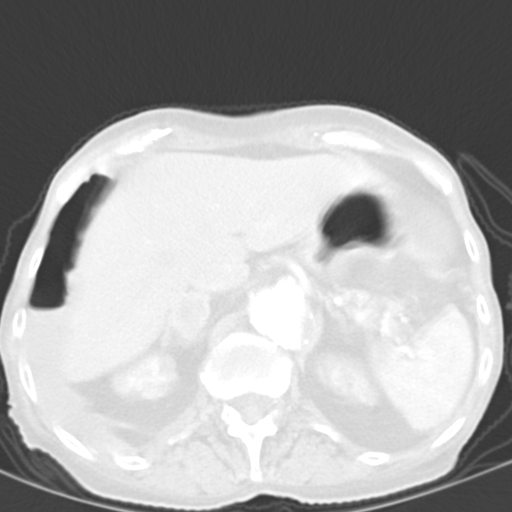
[im 21/69  lung]
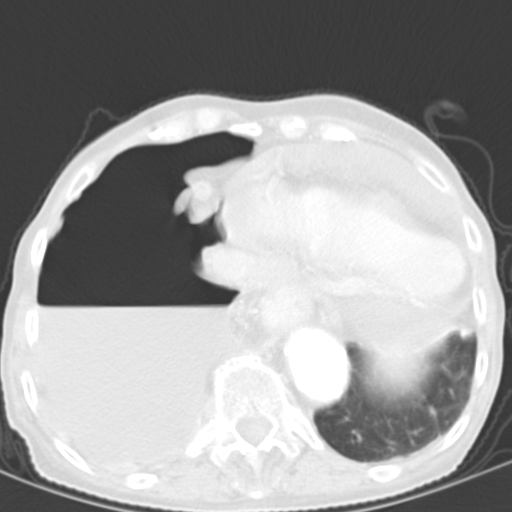
[im 28/69  lung]
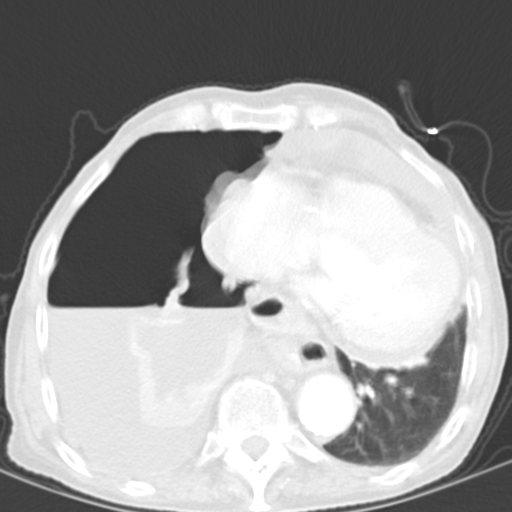
[im 36/69  mediastinal]
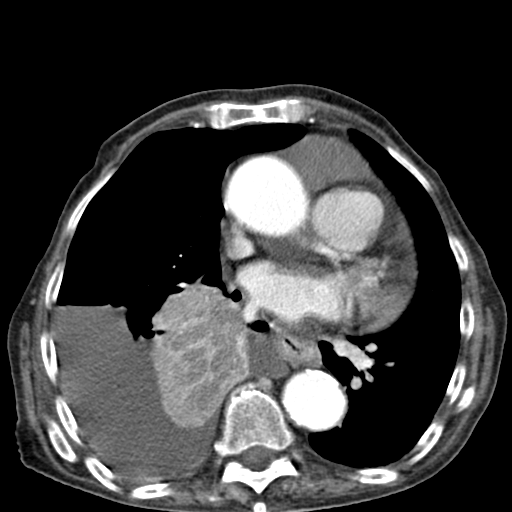
[im 36/69  lung]
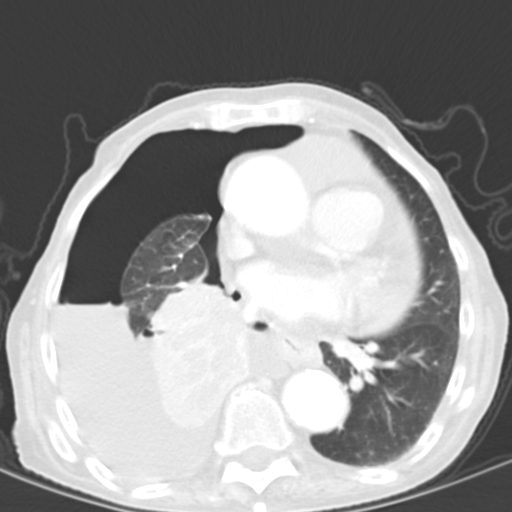
[im 41/69  lung]
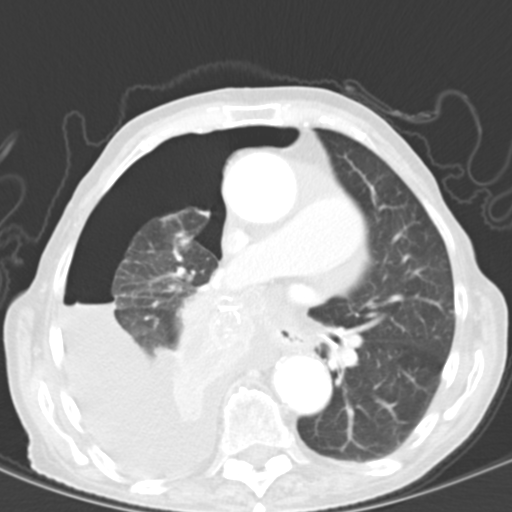
[im 48/69  lung]
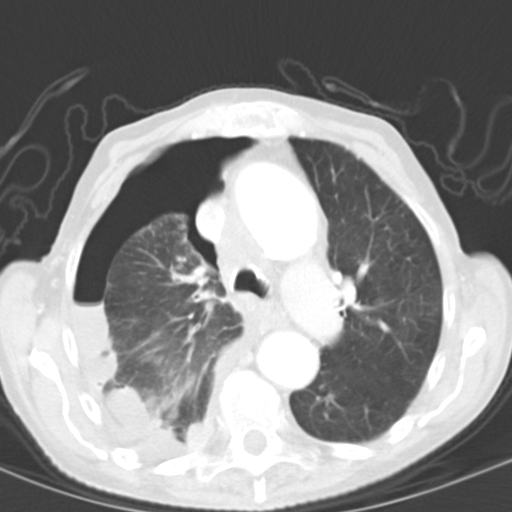
[im 56/69  lung]
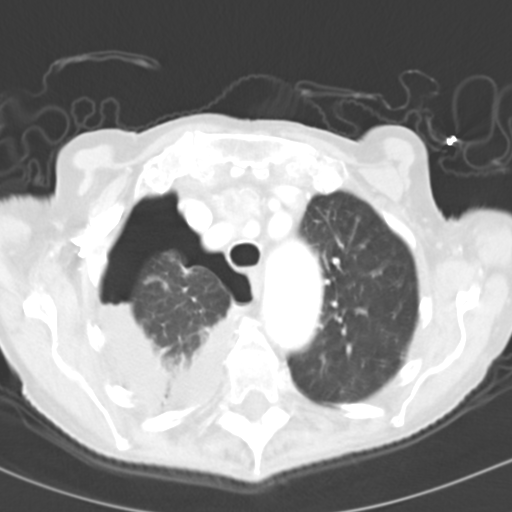
[im 63/69  mediastinal]
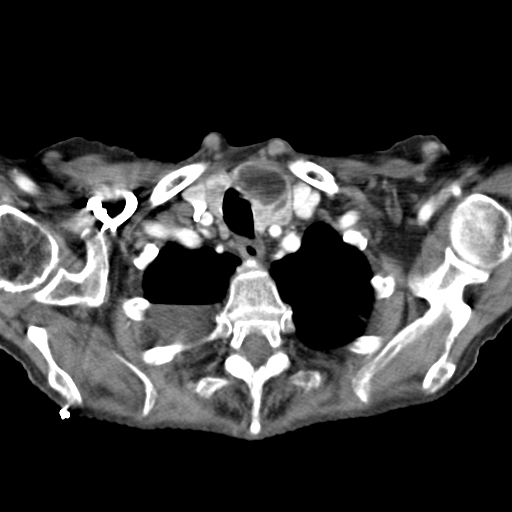
[im 63/69  lung]
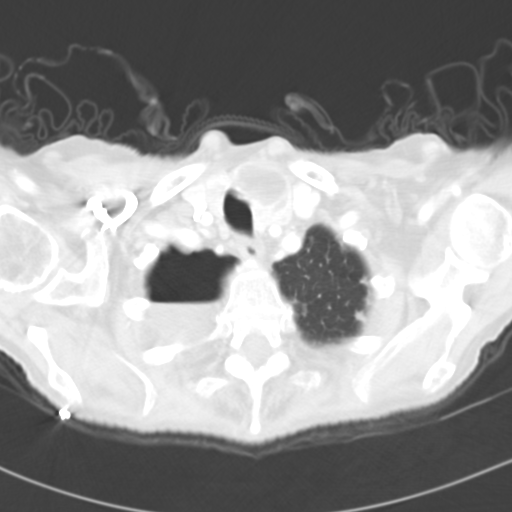

[Series 5: cor routine chest with · coronal · 0.54mm/px · 3 of 119 slices shown]
[im 24/119  lung]
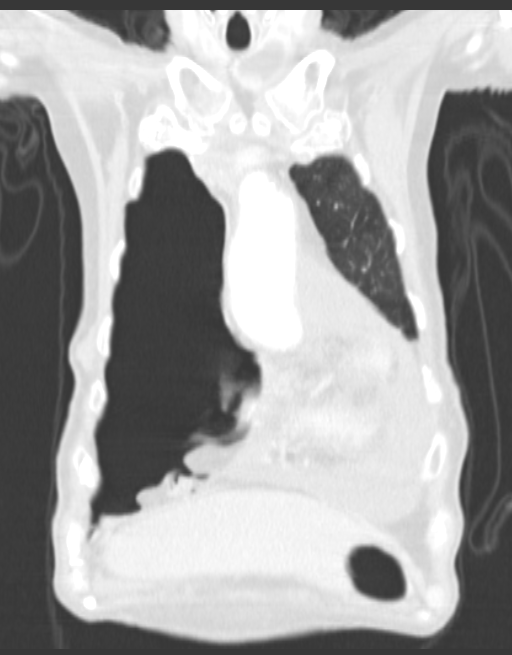
[im 48/119  lung]
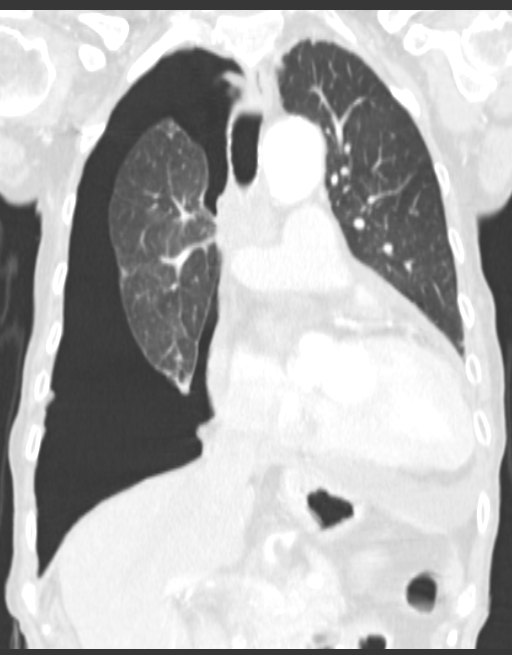
[im 71/119  lung]
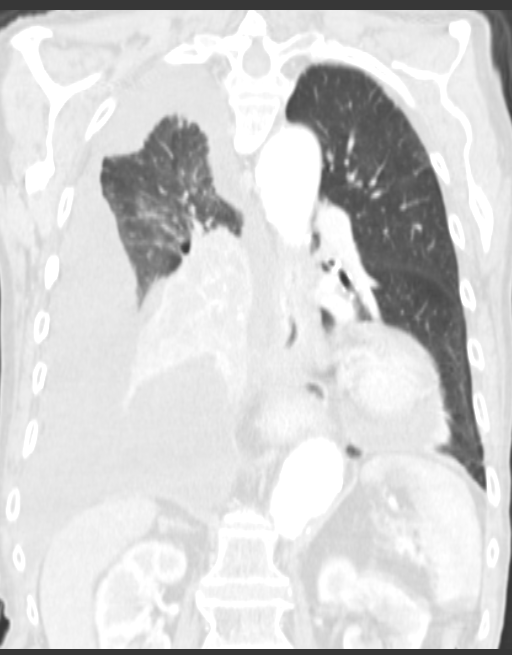

[12 of 36 positions shown; findings below may reference images not displayed]

FINDINGS: Interval development of a moderate to large sized right-sided hydro
pneumothorax. There is near complete consolidation of the right
middle and lower lobes with apparent impaction within the right
bronchus intermedius (image 28, series 2). Given the consolidative
appearance of the right middle and lower lobes, a discrete
underlying pulmonary mass is not excluded. There is minimal residual
aeration of the right upper lobe.

There scattered punctate (sub 5 mm) subpleural nodular opacities
within the contralateral left upper lobe (representative image 23,
series 3). No discrete focal airspace opacities within the left
lung. No left-sided pleural effusion. The left sided pulmonary
airways appear widely patent.

Mediastinal lymphadenopathy with index ill-defined precarinal nodal
conglomeration measuring 2.8 x 2.8 cm (image 24, series 2 and index
retrosternal/prevascular nodal conglomeration measuring
approximately 1.6 x 3.1 cm (image 18, series 2). Pericardial
lymphadenopathy with index pericardial lymph node measuring 1.1 cm
in greatest short axis diameter (image 49, series 2).

Cardiomegaly. Calcifications within the aortic annulus and
potentially within the aortic leaflets. Moderate to large-sized
pericardial effusion.

Moderate to large amount of mixed calcified and noncalcified
atherosclerotic plaque throughout the thoracic aorta. There is
eccentric noncalcified atherosclerotic plaque involving the left
lateral aspect of the distal thoracic aorta an cranial aspect of the
abdominal aorta extending to the takeoff of the left renal artery,
not definitely resulting in hemodynamically significant stenosis.

There is fusiform aneurysmal dilatation of the ascending thoracic
aorta measuring approximately 4 cm in maximal oblique coronal
dimension (coronal image 31, series 5). The descending thoracic
aorta is ectatic measuring approximately 3 cm at the level of the
main pulmonary artery (image 28, series 2 an approximately 3.1 cm at
the level of the diaphragmatic hiatus (image 51, series 2). No
definite thoracic aortic dissection or periaortic stranding on this
nongated examination.

While this examination was not tailored for the evaluation the
pulmonary arteries, there are no discrete filling defect in the
central pulmonary arterial tree to suggest central pulmonary
embolism.

Moderate size hiatal hernia. The esophagus appears patulous and
mildly diffusely thick-walled throughout its course, especially
distally (representative image 36, series 2).

Limited evaluation of the upper abdomen demonstrates an
approximately 2.2 x 2.0 cm hypo attenuating (23 Hounsfield unit)
right adrenal gland nodule (image 58, series 2). An addition
apparent nodule within the left retroperitoneum is favored to
represent a mildly distended loop of duodenum though is incompletely
imaged.

No acute or aggressive osseous abnormalities.

Note is made of an approximately 3.0 x 2.1 cm hypo attenuating mass
within the left lobe of the thyroid. The thyroid appears diffusely
heterogeneous with substernal extension. Regional soft tissues
appear otherwise normal.
IMPRESSION: 1. Moderate to large sized right-sided hydro pneumothorax with near
complete consolidation of the right middle and lower lobes with
impaction within the right bronchus intermedius.
2. Mediastinal lymphadenopathy worrisome for metastatic disease.
Note, given near complete consolidation of the right middle and
lower lobes, an underlying pulmonary malignancy is not excluded on
the basis of this examination. Given the apparent obstruction of the
right bronchus intermedius, further evaluation with bronchoscopy
could be performed as clinically indicated.
3. Indeterminate approximately 2.2 cm nodule within right adrenal
gland, incompletely evaluated on the present examination, and as
such, metastatic disease is not excluded.
4. Cardiomegaly with development of a moderate to large-sized
pericardial effusion. Further evaluation with cardiac echo could be
performed as clinically indicated.
5. Fusiform aneurysmal dilatation of the ascending thoracic aorta
measuring 4 cm in maximal diameter.
6. Moderate-sized hiatal hernia. The esophagus appears patulous and
slightly thick walled throughout its course, primarily distally.
Further evaluation with endoscopy could be performed as clinically
indicated.
7. Indeterminate approximately 3 cm hypo attenuating mass within the
left lobe of the thyroid. Further evaluation with nonemergent
dedicated thyroid ultrasound could be performed as clinically
indicated.
I attempted to contact the ordering physician, Dr. Nuris, however
despite prolonged efforts, I was unable to reach her. As such, the
patient was escorted directly to the [REDACTED] for further evaluation and management.

## 2015-04-01 IMAGING — CR DG CHEST 1V PORT
1 series · 1 of 1 positions shown · non-contrast
Comparison: Radiograph 04/01/2014

CLINICAL DATA: Right pleural effusion, chest tube

EXAM:
PORTABLE CHEST - 1 VIEW

[ap]
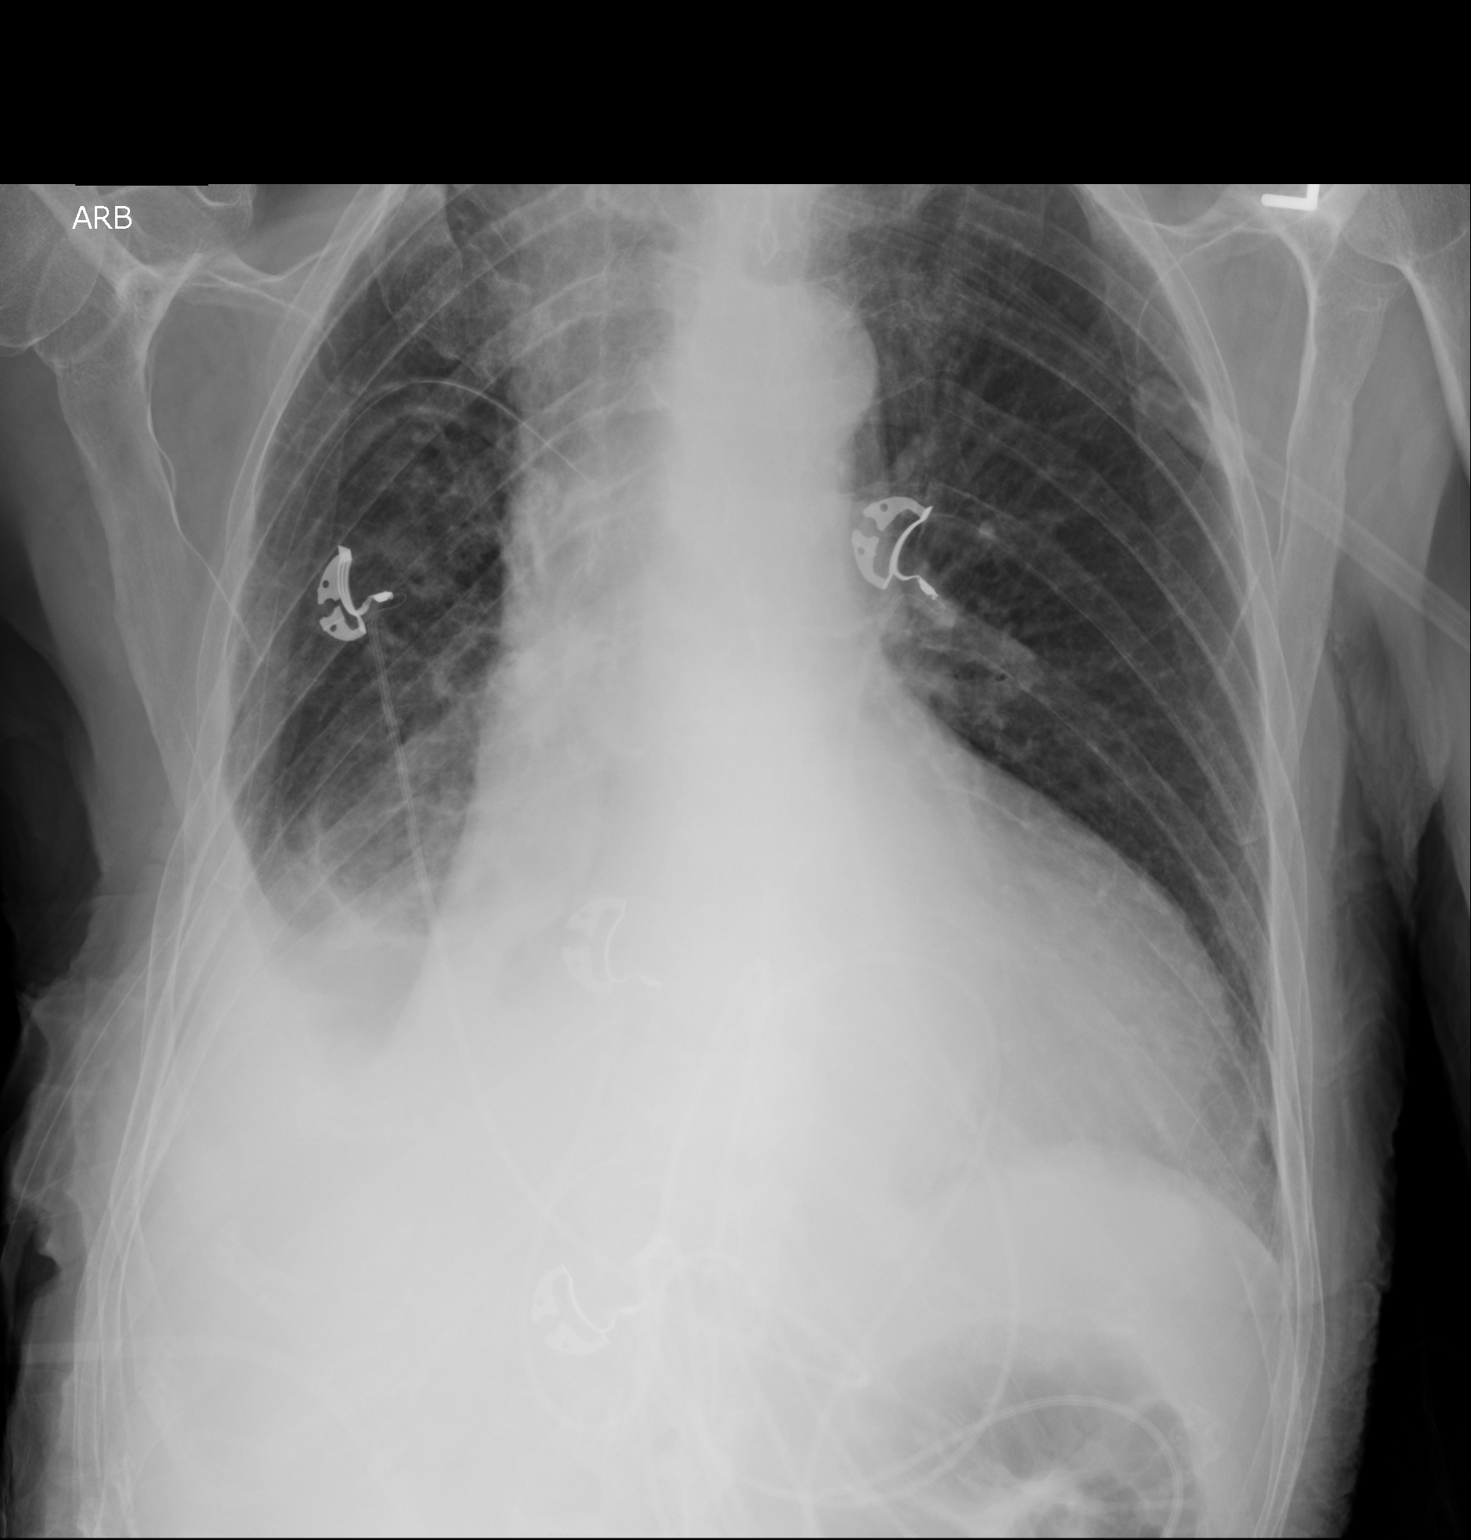

[1 of 1 positions shown; findings below may reference images not displayed]

FINDINGS: Stable enlarged cardiac silhouette. There is a chronic right
effusion. Right chest tube in place. Small right apical pneumothorax
measures 3 mm from chest wall unchanged from prior.
IMPRESSION: 1. No interval change.
2. Right pleural effusion with small pneumothorax in chest tube in
place.
3. Marked cardiomegaly
